# Patient Record
Sex: Male | Born: 1990 | Race: White | Hispanic: No | Marital: Married | Smoking: Current every day smoker
Health system: Southern US, Community
[De-identification: ages and names within clinical notes are randomized; demographics above are authoritative.]

## PROBLEM LIST (undated history)

## (undated) DIAGNOSIS — E079 Disorder of thyroid, unspecified: Secondary | ICD-10-CM

## (undated) DIAGNOSIS — K219 Gastro-esophageal reflux disease without esophagitis: Secondary | ICD-10-CM

## (undated) DIAGNOSIS — I10 Essential (primary) hypertension: Secondary | ICD-10-CM

## (undated) HISTORY — PX: TONSILLECTOMY: SUR1361

---
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
## 2011-06-18 ENCOUNTER — Emergency Department (HOSPITAL_COMMUNITY)
Admission: EM | Admit: 2011-06-18 | Discharge: 2011-06-19 | Disposition: A | Discharge status: Home / Self Care | Payer: BC Managed Care – PPO | Attending: Emergency Medicine | Admitting: Emergency Medicine

## 2011-06-18 DIAGNOSIS — I10 Essential (primary) hypertension: Secondary | ICD-10-CM | POA: Insufficient documentation

## 2011-06-18 DIAGNOSIS — L509 Urticaria, unspecified: Secondary | ICD-10-CM | POA: Insufficient documentation

## 2011-06-18 HISTORY — DX: Essential (primary) hypertension: I10

## 2011-06-18 MED ORDER — EPINEPHRINE HCL 0.1 MG/ML IJ SOLN
0.3000 mg | Freq: Once | INTRAMUSCULAR | Status: AC
  Administered 2011-06-18: 0.3 mg via INTRAMUSCULAR
  Filled 2011-06-18: qty 10

## 2011-06-18 MED ORDER — SODIUM CHLORIDE 0.9 % IV SOLN
Freq: Once | INTRAVENOUS | Status: AC
  Administered 2011-06-18: 23:00:00 via INTRAVENOUS

## 2011-06-18 MED ORDER — DIPHENHYDRAMINE HCL 50 MG/ML IJ SOLN
25.0000 mg | Freq: Once | INTRAMUSCULAR | Status: AC
  Administered 2011-06-18: 25 mg via INTRAVENOUS
  Filled 2011-06-18: qty 1

## 2011-06-18 MED ORDER — METHYLPREDNISOLONE SODIUM SUCC 125 MG IJ SOLR
125.0000 mg | Freq: Once | INTRAMUSCULAR | Status: AC
  Administered 2011-06-18: 125 mg via INTRAVENOUS
  Filled 2011-06-18: qty 2

## 2011-06-18 NOTE — ED Notes (Signed)
Allergic reaction to ?new body wash, first noted this am, tried benadryl, helps with itching, rash to entire body, denis diff. breathing

## 2011-06-18 NOTE — ED Provider Notes (Signed)
History     CSN: [id number] Arrival date & time: 06/18/2011 10:15 PM  Chief Complaint  Patient presents with  . Allergic Reaction    HPI  (Consider location/radiation/quality/duration/timing/severity/associated sxs/prior treatment)  HPI Comments: Pt  States about 7AM he awaken to a rash. He took a shower and took benadryl. This helped but the rash and itching came back. He denies any new foods or medications. No new otc meds.  He works around [employer]), but no new chemicals. No previous hx of rash or itching.  Patient is a 20 y.o. male presenting with allergic reaction. The history is provided by the patient.  Allergic Reaction The primary symptoms are  rash and urticaria. The primary symptoms do not include wheezing, shortness of breath, cough, abdominal pain, nausea, vomiting, dizziness, palpitations or angioedema. The current episode started 6 to 12 hours ago. The problem has been gradually worsening.  Associated with: Unknown.    Past Medical History  Diagnosis Date  . Hypertension     Past Surgical History  Procedure Date  . Tonsillectomy     No family history on file.  History  Substance Use Topics  . Smoking status: Never Smoker   . Smokeless tobacco: Not on file  . Alcohol Use: No      Review of Systems  Review of Systems  Constitutional: Negative for activity change.       All ROS Neg except as noted in HPI  HENT: Negative for nosebleeds and neck pain.   Eyes: Negative for photophobia and discharge.  Respiratory: Negative for cough, shortness of breath and wheezing.   Cardiovascular: Negative for chest pain and palpitations.  Gastrointestinal: Negative for nausea, vomiting, abdominal pain and blood in stool.  Genitourinary: Negative for dysuria, frequency and hematuria.  Musculoskeletal: Negative for back pain and arthralgias.  Skin: Positive for rash.  Neurological: Negative for dizziness, seizures and speech difficulty.    Psychiatric/Behavioral: Negative for hallucinations and confusion.    Allergies  Tylenol  Home Medications   Current Outpatient Rx  Name Route Sig Dispense Refill  . ENALAPRIL MALEATE 10 MG PO TABS Oral Take 10 mg by mouth 2 (two) times daily.      Marland Kitchen NIACIN (ANTIHYPERLIPIDEMIC) 1000 MG PO TBCR Oral Take 1,000 mg by mouth at bedtime.      Marland Kitchen FISH OIL 1000 MG PO CAPS Oral Take 1 capsule by mouth daily.        Physical Exam    BP 148/70  Pulse 115  Temp(Src) 99.3 F (37.4 C) (Oral)  Resp 22  Ht 6\' 1"  (1.854 m)  Wt 197 lb (89.359 kg)  BMI 25.99 kg/m2  SpO2 100%  Physical Exam  Nursing note and vitals reviewed. Constitutional: He is oriented to person, place, and time. He appears well-developed and well-nourished.  Non-toxic appearance.  HENT:  Head: Normocephalic.  Right Ear: Tympanic membrane and external ear normal.  Left Ear: Tympanic membrane and external ear normal.  Eyes: EOM and lids are normal. Pupils are equal, round, and reactive to light.  Neck: Normal range of motion. Neck supple. Carotid bruit is not present. No tracheal deviation present.  Cardiovascular: Normal rate, regular rhythm, normal heart sounds, intact distal pulses and normal pulses.   Pulmonary/Chest: Effort normal and breath sounds normal. No stridor. No respiratory distress. He has no wheezes. He has no rales.  Abdominal: Soft. Bowel sounds are normal. There is no tenderness. There is no guarding.  Musculoskeletal: Normal range of motion.  Lymphadenopathy:  Head (right side): No submandibular adenopathy present.       Head (left side): No submandibular adenopathy present.    He has no cervical adenopathy.  Neurological: He is alert and oriented to person, place, and time. He has normal strength. No cranial nerve deficit or sensory deficit.  Skin: Skin is warm and dry.       Multiple areas of hives and redness.  Psychiatric: He has a normal mood and affect. His speech is normal.    ED  Course:1148 Hives and redness improving. Pt states he feels a lot better. No SOB. Itching improved. Airway clear. 0015 Itching and hives continue to improve. Pt feels he can manage this at home with allergist evaluation.  Procedures (including critical care time)  Labs Reviewed - No data to display No results found.   Dx: Hives  MDM I have reviewed nursing notes, vital signs, and all appropriate lab and imaging results for this patient.  Plan: Rx for prednisone, allegra 180, and vistaril at bed time given. Pt to see MD at [facility].      Kathie Dike, [place] 06/19/11 [phone]

## 2011-06-19 MED ORDER — HYDROXYZINE PAMOATE 25 MG PO CAPS: ORAL_CAPSULE | ORAL | Status: DC

## 2011-06-19 MED ORDER — FEXOFENADINE HCL 180 MG PO TABS: 180.0000 mg | ORAL_TABLET | Freq: Every day | ORAL | Status: DC

## 2011-06-19 MED ORDER — PREDNISONE 10 MG PO TABS: ORAL_TABLET | ORAL | Status: DC

## 2011-07-14 NOTE — ED Provider Notes (Signed)
Evaluation and management procedures were performed by the PA/NP under my supervision/collaboration.    Dillie Burandt D Cataleyah Colborn, MD 07/14/11 1927 

## 2012-12-07 ENCOUNTER — Emergency Department (HOSPITAL_COMMUNITY)
Admission: EM | Admit: 2012-12-07 | Discharge: 2012-12-07 | Disposition: A | Discharge status: Home / Self Care | Payer: BC Managed Care – PPO | Attending: Emergency Medicine | Admitting: Emergency Medicine

## 2012-12-07 ENCOUNTER — Emergency Department (HOSPITAL_COMMUNITY): Payer: BC Managed Care – PPO

## 2012-12-07 ENCOUNTER — Encounter (HOSPITAL_COMMUNITY): Payer: Self-pay

## 2012-12-07 DIAGNOSIS — Z79899 Other long term (current) drug therapy: Secondary | ICD-10-CM | POA: Insufficient documentation

## 2012-12-07 DIAGNOSIS — I10 Essential (primary) hypertension: Secondary | ICD-10-CM | POA: Insufficient documentation

## 2012-12-07 DIAGNOSIS — R109 Unspecified abdominal pain: Secondary | ICD-10-CM | POA: Insufficient documentation

## 2012-12-07 LAB — CBC WITH DIFFERENTIAL/PLATELET
Eosinophils Absolute: 0.2 10*3/uL (ref 0.0–0.7)
Hemoglobin: 16.4 g/dL (ref 13.0–17.0)
Lymphs Abs: 1.9 10*3/uL (ref 0.7–4.0)
Monocytes Relative: 6 % (ref 3–12)
Neutro Abs: 6.9 10*3/uL (ref 1.7–7.7)
Neutrophils Relative %: 72 % (ref 43–77)
Platelets: 356 10*3/uL (ref 150–400)
RBC: 5.26 MIL/uL (ref 4.22–5.81)
WBC: 9.7 10*3/uL (ref 4.0–10.5)

## 2012-12-07 LAB — COMPREHENSIVE METABOLIC PANEL
BUN: 8 mg/dL (ref 6–23)
CO2: 27 mEq/L (ref 19–32)
Calcium: 10 mg/dL (ref 8.4–10.5)
Chloride: 101 mEq/L (ref 96–112)
Creatinine, Ser: 0.94 mg/dL (ref 0.50–1.35)
GFR calc Af Amer: >90 mL/min (ref >90)
GFR calc non Af Amer: >90 mL/min (ref >90)
Glucose, Bld: 97 mg/dL (ref 70–99)
Total Bilirubin: 0.6 mg/dL (ref 0.3–1.2)

## 2012-12-07 LAB — URINALYSIS, ROUTINE W REFLEX MICROSCOPIC
Bilirubin Urine: NEGATIVE
Glucose, UA: NEGATIVE mg/dL
Ketones, ur: NEGATIVE mg/dL
Leukocytes, UA: NEGATIVE
Protein, ur: NEGATIVE mg/dL

## 2012-12-07 MED ORDER — SODIUM CHLORIDE 0.9 % IV BOLUS (SEPSIS)
1000.0000 mL | Freq: Once | INTRAVENOUS | Status: AC
  Administered 2012-12-07: 1000 mL via INTRAVENOUS

## 2012-12-07 MED ORDER — KETOROLAC TROMETHAMINE 30 MG/ML IJ SOLN
30.0000 mg | Freq: Once | INTRAMUSCULAR | Status: AC
  Administered 2012-12-07: 30 mg via INTRAVENOUS
  Filled 2012-12-07: qty 1

## 2012-12-07 MED ORDER — ONDANSETRON HCL 4 MG/2ML IJ SOLN
4.0000 mg | Freq: Once | INTRAMUSCULAR | Status: AC
  Administered 2012-12-07: 4 mg via INTRAVENOUS
  Filled 2012-12-07: qty 2

## 2012-12-07 NOTE — ED Notes (Signed)
Pt reporting pain previously in right side, but improved at this time.  Also reporting some improvement in nausea.  No distress noted.  Denies needs at present.

## 2012-12-07 NOTE — ED Notes (Signed)
Pt complains of right flank pain that radiates to right groin. Pt denies dysuria and hematuria. Pt vomited a few times today. Denies fever.

## 2012-12-07 NOTE — ED Provider Notes (Signed)
History     CSN: [id number]  Arrival date & time 12/07/12  1750   First MD Initiated Contact with Patient 12/07/12 1911      Chief Complaint  Patient presents with  . Flank Pain    (Consider location/radiation/quality/duration/timing/severity/associated sxs/prior treatment) HPI The patient presents with the acute onset of right flank pain.  This began approximately 2 hours ago.  The pain was initially sharp, stabbing, with radiation to the inguinal crease.  After approximately 40 minutes the pain eased, and is only present in the right flank currently.  No specific attempts at relief.  No concurrent nausea, vomiting, dysuria, hematuria. The patient did have episodes of vomiting earlier today he states that this is not atypical for him he recently stopped taking antacids.  He notes that prior to that he had chronic GERD. The patient is generally healthy.  He does have a family history of polycystic kidney disease, though the patient does not have this entity.  Past Medical History  Diagnosis Date  . Hypertension     Past Surgical History  Procedure Laterality Date  . Tonsillectomy      No family history on file.  History  Substance Use Topics  . Smoking status: Never Smoker   . Smokeless tobacco: Not on file  . Alcohol Use: No      Review of Systems  Constitutional:       Per HPI, otherwise negative  HENT:       Per HPI, otherwise negative  Respiratory:       Per HPI, otherwise negative  Cardiovascular:       Per HPI, otherwise negative  Gastrointestinal: Negative for vomiting.  Endocrine:       Negative aside from HPI  Genitourinary:       Neg aside from HPI   Musculoskeletal:       Per HPI, otherwise negative  Skin: Negative.   Neurological: Negative for syncope.    Allergies  Tylenol; Codeine; and Penicillins  Home Medications   Current Outpatient Rx  Name  Route  Sig  Dispense  Refill  . enalapril (VASOTEC) 10 MG tablet   Oral   Take 10 mg by  mouth 2 (two) times daily.           Marland Kitchen omeprazole (PRILOSEC) 20 MG capsule   Oral   Take 20 mg by mouth daily.           BP 155/96  Pulse 90  Temp(Src) 97.9 F (36.6 C) (Oral)  Resp 16  Ht 6\' 1"  (1.854 m)  Wt 210 lb (95.255 kg)  BMI 27.71 kg/m2  SpO2 100%  Physical Exam  Nursing note and vitals reviewed. Constitutional: He is oriented to person, place, and time. He appears well-developed. No distress.  HENT:  Head: Normocephalic and atraumatic.  Eyes: Conjunctivae and EOM are normal.  Cardiovascular: Normal rate and regular rhythm.   Pulmonary/Chest: Effort normal. No stridor. No respiratory distress.  Abdominal: He exhibits no distension.  Musculoskeletal: He exhibits no edema.  Neurological: He is alert and oriented to person, place, and time.  Skin: Skin is warm and dry.  Psychiatric: He has a normal mood and affect.    ED Course  Procedures (including critical care time)  Labs Reviewed  URINALYSIS, ROUTINE W REFLEX MICROSCOPIC  COMPREHENSIVE METABOLIC PANEL  CBC WITH DIFFERENTIAL   No results found.   No diagnosis found.   Labs / CT reviewed MDM  This generally well-appearing young male, though with  a familial history of polycystic kidney disease now presents with the acute onset of right flank pain with radiation to the groin.  The patient denies any scrotal changes, and on exam is in no distress, with no focal physical exam abnormalities, unremarkable vital signs beyond mild hypertension.  The patient's labs and CT did not demonstrate presence of a Weseman come in or any notable abnormalities.  The etiology of the patient's pain is unclear, absent distress, with the aforementioned unremarkable findings, he is stable for discharge with followup as an outpatient.        Gerhard Munch, MD 12/07/12 [phone]

## 2013-05-09 ENCOUNTER — Encounter (HOSPITAL_COMMUNITY): Payer: Self-pay

## 2013-05-09 ENCOUNTER — Emergency Department (HOSPITAL_COMMUNITY)
Admission: EM | Admit: 2013-05-09 | Discharge: 2013-05-09 | Disposition: A | Discharge status: Home / Self Care | Payer: BC Managed Care – PPO | Attending: Emergency Medicine | Admitting: Emergency Medicine

## 2013-05-09 DIAGNOSIS — Y929 Unspecified place or not applicable: Secondary | ICD-10-CM | POA: Insufficient documentation

## 2013-05-09 DIAGNOSIS — Z79899 Other long term (current) drug therapy: Secondary | ICD-10-CM | POA: Insufficient documentation

## 2013-05-09 DIAGNOSIS — Y939 Activity, unspecified: Secondary | ICD-10-CM | POA: Insufficient documentation

## 2013-05-09 DIAGNOSIS — W268XXA Contact with other sharp object(s), not elsewhere classified, initial encounter: Secondary | ICD-10-CM | POA: Insufficient documentation

## 2013-05-09 DIAGNOSIS — I10 Essential (primary) hypertension: Secondary | ICD-10-CM | POA: Insufficient documentation

## 2013-05-09 DIAGNOSIS — Z880 Allergy status to penicillin: Secondary | ICD-10-CM | POA: Insufficient documentation

## 2013-05-09 DIAGNOSIS — S51812A Laceration without foreign body of left forearm, initial encounter: Secondary | ICD-10-CM

## 2013-05-09 DIAGNOSIS — S51809A Unspecified open wound of unspecified forearm, initial encounter: Secondary | ICD-10-CM | POA: Insufficient documentation

## 2013-05-09 MED ORDER — LIDOCAINE HCL (PF) 1 % IJ SOLN
5.0000 mL | Freq: Once | INTRAMUSCULAR | Status: AC
  Administered 2013-05-09: 5 mL

## 2013-05-09 MED ORDER — LIDOCAINE HCL (PF) 1 % IJ SOLN
INTRAMUSCULAR | Status: AC
  Filled 2013-05-09: qty 5

## 2013-05-09 NOTE — ED Provider Notes (Signed)
CSN: [id number]     Arrival date & time 05/09/13  1907 History     First MD Initiated Contact with Patient 05/09/13 1927     Chief Complaint  Patient presents with  . Extremity Laceration   (Consider location/radiation/quality/duration/timing/severity/associated sxs/prior Treatment) HPI Comments: Randy Goodman is a 22 y.o. Male presenting with a laceration to his left volar forearm, which occurred one hour before arrival when he dropped a saw blade (the saw was not running) on his forearm. He maintained gentle pressure and obtained hemostasis.  He denies pain, numbness, weakness or tingling distal to the injury site.  He is utd with his tetanus vaccines.      The history is provided by the patient.    Past Medical History  Diagnosis Date  . Hypertension    Past Surgical History  Procedure Laterality Date  . Tonsillectomy     History reviewed. No pertinent family history. History  Substance Use Topics  . Smoking status: Never Smoker   . Smokeless tobacco: Not on file  . Alcohol Use: No    Review of Systems  Constitutional: Negative for fever and chills.  HENT: Negative for facial swelling.   Respiratory: Negative for shortness of breath and wheezing.   Skin: Positive for wound.  Neurological: Negative for numbness.    Allergies  Tylenol; Codeine; and Penicillins  Home Medications   Current Outpatient Rx  Name  Route  Sig  Dispense  Refill  . enalapril (VASOTEC) 10 MG tablet   Oral   Take 10 mg by mouth 2 (two) times daily.           Marland Kitchen omeprazole (PRILOSEC) 20 MG capsule   Oral   Take 20 mg by mouth daily.          BP 163/91  Pulse 88  Temp(Src) 98.1 F (36.7 C) (Oral)  Resp 18  Ht 6' (1.829 m)  Wt 210 lb (95.255 kg)  BMI 28.47 kg/m2  SpO2 100% Physical Exam  Constitutional: He is oriented to person, place, and time. He appears well-developed and well-nourished.  HENT:  Head: Normocephalic.  Cardiovascular: Normal rate.   Pulmonary/Chest:  Effort normal.  Musculoskeletal: He exhibits tenderness.  2 cm subcutaneous laceration mid volar left forearm,  Hemostatic.    Neurological: He is alert and oriented to person, place, and time. No sensory deficit.  FROM of fingers, wrist,  No decreased strength, normal sensation.  Skin: Laceration noted.    ED Course   Procedures (including critical care time)  LACERATION REPAIR Performed by: Burgess Amor Authorized by: Burgess Amor Consent: Verbal consent obtained. Risks and benefits: risks, benefits and alternatives were discussed Consent given by: patient Patient identity confirmed: provided demographic data Prepped and Draped in normal sterile fashion Wound explored  Laceration Location: left forearm  Laceration Length: 2cm  No Foreign Bodies seen or palpated  Anesthesia: local infiltration  Local anesthetic: lidocaine 1% without epinephrine  Anesthetic total: 2 ml  Irrigation method: syringe Amount of cleaning: standard  Skin closure: ethilon 4-0  Number of sutures: 5  Technique: simple interrupted  Patient tolerance: Patient tolerated the procedure well with no immediate complications.   Labs Reviewed - No data to display No results found. 1. Laceration of left forearm without complication     MDM  Wound care instructions given.  Pt advised to have sutures removed in 10 days,  Return here sooner for any signs of infection including redness, swelling, worse pain or drainage of pus.  Burgess Amor, PA-C 05/09/13 2035

## 2013-05-09 NOTE — ED Notes (Signed)
I was cutting some wood and dropped the saw (it was not running) and the sharp blade cut my arm. Bleeding controlled at this time. Laceration located to left forearm.

## 2013-05-09 NOTE — ED Notes (Signed)
Lac to lt forearm sutured. And bandaged. Cut on a saw.

## 2013-05-10 NOTE — ED Provider Notes (Signed)
Medical screening examination/treatment/procedure(s) were performed by non-physician practitioner and as supervising physician I was immediately available for consultation/collaboration.  Malay Fantroy, MD 05/10/13 1613 

## 2014-06-24 ENCOUNTER — Encounter (HOSPITAL_COMMUNITY): Payer: Self-pay | Admitting: Emergency Medicine

## 2014-06-24 ENCOUNTER — Emergency Department (HOSPITAL_COMMUNITY)
Admission: EM | Admit: 2014-06-24 | Discharge: 2014-06-24 | Disposition: A | Discharge status: Home / Self Care | Payer: BC Managed Care – PPO | Attending: Emergency Medicine | Admitting: Emergency Medicine

## 2014-06-24 DIAGNOSIS — L259 Unspecified contact dermatitis, unspecified cause: Secondary | ICD-10-CM | POA: Insufficient documentation

## 2014-06-24 DIAGNOSIS — R21 Rash and other nonspecific skin eruption: Secondary | ICD-10-CM | POA: Insufficient documentation

## 2014-06-24 DIAGNOSIS — Z880 Allergy status to penicillin: Secondary | ICD-10-CM | POA: Diagnosis not present

## 2014-06-24 DIAGNOSIS — L239 Allergic contact dermatitis, unspecified cause: Secondary | ICD-10-CM

## 2014-06-24 DIAGNOSIS — I10 Essential (primary) hypertension: Secondary | ICD-10-CM | POA: Insufficient documentation

## 2014-06-24 DIAGNOSIS — Z79899 Other long term (current) drug therapy: Secondary | ICD-10-CM | POA: Diagnosis not present

## 2014-06-24 MED ORDER — LORATADINE 10 MG PO TABS: 10.0000 mg | ORAL_TABLET | Freq: Every day | ORAL | Status: DC

## 2014-06-24 MED ORDER — METHYLPREDNISOLONE SODIUM SUCC 40 MG IJ SOLR
80.0000 mg | Freq: Once | INTRAMUSCULAR | Status: AC
  Administered 2014-06-24: 80 mg via INTRAVENOUS
  Filled 2014-06-24: qty 2

## 2014-06-24 MED ORDER — PREDNISONE 20 MG PO TABS: 20.0000 mg | ORAL_TABLET | Freq: Two times a day (BID) | ORAL | Status: DC

## 2014-06-24 MED ORDER — HYDROXYZINE HCL 25 MG PO TABS: 25.0000 mg | ORAL_TABLET | Freq: Four times a day (QID) | ORAL | Status: DC

## 2014-06-24 NOTE — ED Notes (Signed)
Rash for 2 days with itching, recently helped a friend unload hay on Saturday but rash is all over, states hay only touched his arms, also had used a new body wash few days ago but went to back to using his usual wash

## 2014-06-24 NOTE — ED Provider Notes (Signed)
CSN: [id number]     Arrival date & time 06/24/14  1503 History   First MD Initiated Contact with Patient 06/24/14 1627     Chief Complaint  Patient presents with  . Rash     (Consider location/radiation/quality/duration/timing/severity/associated sxs/prior Treatment) Patient is a 23 y.o. male presenting with rash. The history is provided by the patient.  Rash Location:  Full body Quality: dryness, itchiness and redness   Severity:  Moderate Onset quality:  Gradual Duration:  2 days Timing:  Constant Progression:  Unchanged Chronicity:  New Context: new detergent/soap   Context comment:  Hay Relieved by:  Nothing Worsened by:  Gracelyn Nurse  is a 23 y.o. male who presents to the ED with rash and itching that started 2 days ago. He reports helping a friend unload hay and the rash started after that. He also used a new body wash but after the rash started he went back to his regular soap.   Past Medical History  Diagnosis Date  . Hypertension    Past Surgical History  Procedure Laterality Date  . Tonsillectomy     History reviewed. No pertinent family history. History  Substance Use Topics  . Smoking status: Never Smoker   . Smokeless tobacco: Not on file  . Alcohol Use: No    Review of Systems  Skin: Positive for rash.  all other systems negative    Allergies  Codeine and Penicillins  Home Medications   Prior to Admission medications   Medication Sig Start Date End Date Taking? Authorizing Provider  enalapril (VASOTEC) 10 MG tablet Take 10 mg by mouth 2 (two) times daily.      Historical Provider, MD  omeprazole (PRILOSEC) 20 MG capsule Take 20 mg by mouth daily.    Historical Provider, MD   BP 150/88  Pulse 59  Temp(Src) 98.6 F (37 C) (Oral)  Resp 16  Ht 6\' 1"  (1.854 m)  Wt 220 lb (99.791 kg)  BMI 29.03 kg/m2  SpO2 100% Physical Exam  Nursing note and vitals reviewed. Constitutional: He is oriented to person, place, and time. He appears  well-developed and well-nourished.  HENT:  Head: Normocephalic.  Eyes: EOM are normal.  Neck: Normal range of motion. Neck supple.  Cardiovascular: Normal rate and regular rhythm.   Pulmonary/Chest: Effort normal. He has no wheezes. He has no rales.  Musculoskeletal: Normal range of motion.  Neurological: He is alert and oriented to person, place, and time. No cranial nerve deficit.  Skin: Skin is warm and dry. Rash noted.  There are red raised area to the upper body that is worse at the belt line and bilateral axilla. He has no rash in the genital area. Minimal rash of the lower extremities.   Psychiatric: He has a normal mood and affect. His behavior is normal.    ED Course  Procedures (including critical care time) Labs Review  MDM  23 y.o. male with rash and itching after unloading hay 2 days ago. Will treat for allergic reaction. Patient stable for discharge without any immediate complications. Discussed with the patient and all questioned fully answered. He will return if any problems arise.    Medication List    TAKE these medications       hydrOXYzine 25 MG tablet  Commonly known as:  ATARAX/VISTARIL  Take 1 tablet (25 mg total) by mouth every 6 (six) hours.     loratadine 10 MG tablet  Commonly known as:  CLARITIN  Take  1 tablet (10 mg total) by mouth daily.     predniSONE 20 MG tablet  Commonly known as:  DELTASONE  Take 1 tablet (20 mg total) by mouth 2 (two) times daily with a meal.      ASK your doctor about these medications       enalapril 10 MG tablet  Commonly known as:  VASOTEC  Take 10 mg by mouth 2 (two) times daily.     omeprazole 20 MG capsule  Commonly known as:  PRILOSEC  Take 20 mg by mouth daily.           [id number]Shylynn Bruning RonanM Derak Schurman, [place]NP 06/25/14 [phone]

## 2014-06-24 NOTE — Discharge Instructions (Signed)
If your symptoms persist follow up with DR. Hall. Return here as needed.   Contact Dermatitis Contact dermatitis is a reaction to certain substances that touch the skin. Contact dermatitis can be either irritant contact dermatitis or allergic contact dermatitis. Irritant contact dermatitis does not require previous exposure to the substance for a reaction to occur.Allergic contact dermatitis only occurs if you have been exposed to the substance before. Upon a repeat exposure, your body reacts to the substance.  CAUSES  Many substances can cause contact dermatitis. Irritant dermatitis is most commonly caused by repeated exposure to mildly irritating substances, such as:  Makeup.  Soaps.  Detergents.  Bleaches.  Acids.  Metal salts, such as nickel. Allergic contact dermatitis is most commonly caused by exposure to:  Poisonous plants.  Chemicals (deodorants, shampoos).  Jewelry.  Latex.  Neomycin in triple antibiotic cream.  Preservatives in products, including clothing. SYMPTOMS  The area of skin that is exposed may develop:  Dryness or flaking.  Redness.  Cracks.  Itching.  Pain or a burning sensation.  Blisters. With allergic contact dermatitis, there may also be swelling in areas such as the eyelids, mouth, or genitals.  DIAGNOSIS  Your caregiver can usually tell what the problem is by doing a physical exam. In cases where the cause is uncertain and an allergic contact dermatitis is suspected, a patch skin test may be performed to help determine the cause of your dermatitis. TREATMENT Treatment includes protecting the skin from further contact with the irritating substance by avoiding that substance if possible. Barrier creams, powders, and gloves may be helpful. Your caregiver may also recommend:  Steroid creams or ointments applied 2 times daily. For best results, soak the rash area in cool water for 20 minutes. Then apply the medicine. Cover the area with a  plastic wrap. You can store the steroid cream in the refrigerator for a "chilly" effect on your rash. That may decrease itching. Oral steroid medicines may be needed in more severe cases.  Antibiotics or antibacterial ointments if a skin infection is present.  Antihistamine lotion or an antihistamine taken by mouth to ease itching.  Lubricants to keep moisture in your skin.  Burow's solution to reduce redness and soreness or to dry a weeping rash. Mix one packet or tablet of solution in 2 cups cool water. Dip a clean washcloth in the mixture, wring it out a bit, and put it on the affected area. Leave the cloth in place for 30 minutes. Do this as often as possible throughout the day.  Taking several cornstarch or baking soda baths daily if the area is too large to cover with a washcloth. Harsh chemicals, such as alkalis or acids, can cause skin damage that is like a burn. You should flush your skin for 15 to 20 minutes with cold water after such an exposure. You should also seek immediate medical care after exposure. Bandages (dressings), antibiotics, and pain medicine may be needed for severely irritated skin.  HOME CARE INSTRUCTIONS  Avoid the substance that caused your reaction.  Keep the area of skin that is affected away from hot water, soap, sunlight, chemicals, acidic substances, or anything else that would irritate your skin.  Do not scratch the rash. Scratching may cause the rash to become infected.  You may take cool baths to help stop the itching.  Only take over-the-counter or prescription medicines as directed by your caregiver.  See your caregiver for follow-up care as directed to make sure your skin is  healing properly. SEEK MEDICAL CARE IF:   Your condition is not better after 3 days of treatment.  You seem to be getting worse.  You see signs of infection such as swelling, tenderness, redness, soreness, or warmth in the affected area.  You have any problems related to  your medicines. Document Released: 09/08/2000 Document Revised: 12/04/2011 Document Reviewed: 02/14/2011 [facility] Patient Information 2015 [place], [place]. This information is not intended to replace advice given to you by your health care provider. Make sure you discuss any questions you have with your health care provider.

## 2014-06-24 NOTE — ED Notes (Signed)
Pt denies difficulty breathing or swallowing. Lungs clear upon auscultation.

## 2014-06-25 NOTE — ED Provider Notes (Signed)
Medical screening examination/treatment/procedure(s) were performed by non-physician practitioner and as supervising physician I was immediately available for consultation/collaboration.   EKG Interpretation None        Kristen N Ward, DO 06/25/14 2351 

## 2015-04-02 ENCOUNTER — Encounter (HOSPITAL_COMMUNITY): Payer: Self-pay | Admitting: Emergency Medicine

## 2015-04-02 ENCOUNTER — Emergency Department (HOSPITAL_COMMUNITY)
Admission: EM | Admit: 2015-04-02 | Discharge: 2015-04-02 | Disposition: A | Discharge status: Home / Self Care | Payer: BLUE CROSS/BLUE SHIELD | Attending: Emergency Medicine | Admitting: Emergency Medicine

## 2015-04-02 DIAGNOSIS — Z880 Allergy status to penicillin: Secondary | ICD-10-CM | POA: Insufficient documentation

## 2015-04-02 DIAGNOSIS — Z79899 Other long term (current) drug therapy: Secondary | ICD-10-CM | POA: Insufficient documentation

## 2015-04-02 DIAGNOSIS — I10 Essential (primary) hypertension: Secondary | ICD-10-CM | POA: Diagnosis not present

## 2015-04-02 DIAGNOSIS — F419 Anxiety disorder, unspecified: Secondary | ICD-10-CM

## 2015-04-02 DIAGNOSIS — K219 Gastro-esophageal reflux disease without esophagitis: Secondary | ICD-10-CM | POA: Insufficient documentation

## 2015-04-02 DIAGNOSIS — Z7952 Long term (current) use of systemic steroids: Secondary | ICD-10-CM | POA: Insufficient documentation

## 2015-04-02 DIAGNOSIS — R0602 Shortness of breath: Secondary | ICD-10-CM | POA: Diagnosis present

## 2015-04-02 HISTORY — DX: Gastro-esophageal reflux disease without esophagitis: K21.9

## 2015-04-02 NOTE — ED Notes (Signed)
Unable to get 12 lead EKG to electronically transfer. [facility] and then to Dr. Hassie Bruceey for review.

## 2015-04-02 NOTE — ED Provider Notes (Signed)
CSN: [id number]     Arrival date & time 04/02/15  1543 History   First MD Initiated Contact with Patient 04/02/15 1614     Chief Complaint  Patient presents with  . Shortness of Breath     (Consider location/radiation/quality/duration/timing/severity/associated sxs/prior Treatment) HPI Comments: Patient is a 24 year old male who presents to the emergency department with a complaint of shortness of breath.  The patient states that he has a history of gastroesophageal reflux disease. He states that from time to time he feels as though his food gets stuck in the back of his throat or mid way his esophagus. He previously had problems with actually throwing up, but since being on Prilosec, he states this has improved some. He states however that now whenever he gets the sensation he also has a sensation of feeling short of breath and very anxious. The patient states that he had an episode approximately 3 months ago, another episode a month ago, and another episode 3 times during this week. The patient states that he is not working right now because his plan is been closed down since July 4, and he wonders if he does not have the distractions that he would normally have left lower cell to get anxious. The patient denies any chest pain. Patient denies unusual sweats. He has not been vomiting during the episodes of the last week. He has not had any unusual weakness, and has been no syncopal episodes. The patient has not had any injury or trauma to the chest. He's had no operations on the esophagus or the chest. His been no blood in his stool. And he is not vomiting any blood. The patient is not a smoker. He states that he occasionally drinks alcohol in particular beer on the weekends, and he denies use of any recreational drug.   PCP: Dr Sherwood GamblerFusco  Patient is a 24 y.o. male presenting with shortness of breath. The history is provided by the patient.  Shortness of Breath Associated symptoms: no chest pain      Past Medical History  Diagnosis Date  . Hypertension   . GERD (gastroesophageal reflux disease)    Past Surgical History  Procedure Laterality Date  . Tonsillectomy     Family History  Problem Relation Age of Onset  . Hypertension Mother   . Hypertension Father   . Kidney disease Father   . Hypertension Other    History  Substance Use Topics  . Smoking status: Never Smoker   . Smokeless tobacco: Never Used  . Alcohol Use: 4.8 oz/week    8 Cans of beer per week     Comment: occas    Review of Systems  Respiratory: Positive for shortness of breath.   Cardiovascular: Negative for chest pain.  Gastrointestinal:       Indigestion  Psychiatric/Behavioral: The patient is nervous/anxious.   All other systems reviewed and are negative.     Allergies  Codeine and Penicillins  Home Medications   Prior to Admission medications   Medication Sig Start Date End Date Taking? Authorizing Provider  enalapril (VASOTEC) 10 MG tablet Take 10 mg by mouth 2 (two) times daily.      Historical Provider, MD  hydrOXYzine (ATARAX/VISTARIL) 25 MG tablet Take 1 tablet (25 mg total) by mouth every 6 (six) hours. 06/24/14   Hope Orlene OchM Neese, NP  loratadine (CLARITIN) 10 MG tablet Take 1 tablet (10 mg total) by mouth daily. 06/24/14   Hope Orlene OchM Neese, NP  omeprazole (PRILOSEC) 20  MG capsule Take 20 mg by mouth daily.    Historical Provider, MD  predniSONE (DELTASONE) 20 MG tablet Take 1 tablet (20 mg total) by mouth 2 (two) times daily with a meal. 06/24/14   Hope Orlene Och, NP   BP 129/84 mmHg  Pulse 77  Temp(Src) 98.7 F (37.1 C) (Oral)  Resp 18  Ht  (1.854 m)  Wt 230 lb (104.327 kg)  BMI 30.35 kg/m2  SpO2 100% Physical Exam  Constitutional: He is oriented to person, place, and time. He appears well-developed and well-nourished.  Non-toxic appearance.  HENT:  Head: Normocephalic.  Right Ear: Tympanic membrane and external ear normal.  Left Ear: Tympanic membrane and external ear  normal.  Eyes: EOM and lids are normal. Pupils are equal, round, and reactive to light.  Neck: Normal range of motion. Neck supple. Carotid bruit is not present.  Cardiovascular: Normal rate, regular rhythm, normal heart sounds, intact distal pulses and normal pulses.   Pulmonary/Chest: Breath sounds normal. No respiratory distress.  Abdominal: Soft. Bowel sounds are normal. There is no tenderness. There is no guarding.  Musculoskeletal: Normal range of motion.  Lymphadenopathy:       Head (right side): No submandibular adenopathy present.       Head (left side): No submandibular adenopathy present.    He has no cervical adenopathy.  Neurological: He is alert and oriented to person, place, and time. He has normal strength. No cranial nerve deficit or sensory deficit.  Skin: Skin is warm and dry.  Psychiatric: He has a normal mood and affect. His speech is normal.  Nursing note and vitals reviewed.   ED Course  Procedures (including critical care time) Labs Review Labs Reviewed - No data to display  Imaging Review No results found.   EKG Interpretation None      MDM  Vital signs stable. Pt no longer anxious. No difficulty breathing. Pt sitting up in bed, conversing with family without problem. No sweats, no chest pain, No hx of injury or change in environment. EKG neg for acute issues. Hx of reflux. Suspect panic/anxiety problem. Pt to discuss this with Dr Sherwood Gambler or a member of his team. Pt in agreement with this plan. He will return to the ED if any changes or problem.   Final diagnoses:  None    *I have reviewed nursing notes, vital signs, and all appropriate lab and imaging results for this patient.[address] PA-C 04/02/15 1712  Margarita Grizzle, MD 04/03/15 [phone]

## 2015-04-02 NOTE — ED Notes (Signed)
Pt reports feeling SOB intermittently x 1 week. NAD noted. Pt states he has hx of GERD, may be having difficulty with reflux. Pt denies CP or dizziness.

## 2015-04-02 NOTE — Discharge Instructions (Signed)
Please see Dr Sherwood GamblerFusco or a member of his team to discuss the problem with swallowing and digesting food, and the problem with anxiety as soon as possible. Return to the Emergency Dept if any changes or problem. Gastroesophageal Reflux Disease, Adult Gastroesophageal reflux disease (GERD) happens when acid from your stomach flows up into the esophagus. When acid comes in contact with the esophagus, the acid causes soreness (inflammation) in the esophagus. Over time, GERD may create small holes (ulcers) in the lining of the esophagus. CAUSES   Increased body weight. This puts pressure on the stomach, making acid rise from the stomach into the esophagus.  Smoking. This increases acid production in the stomach.  Drinking alcohol. This causes decreased pressure in the lower esophageal sphincter (valve or ring of muscle between the esophagus and stomach), allowing acid from the stomach into the esophagus.  Late evening meals and a full stomach. This increases pressure and acid production in the stomach.  A malformed lower esophageal sphincter. Sometimes, no cause is found. SYMPTOMS   Burning pain in the lower part of the mid-chest behind the breastbone and in the mid-stomach area. This may occur twice a week or more often.  Trouble swallowing.  Sore throat.  Dry cough.  Asthma-like symptoms including chest tightness, shortness of breath, or wheezing. DIAGNOSIS  Your caregiver may be able to diagnose GERD based on your symptoms. In some cases, X-rays and other tests may be done to check for complications or to check the condition of your stomach and esophagus. TREATMENT  Your caregiver may recommend over-the-counter or prescription medicines to help decrease acid production. Ask your caregiver before starting or adding any new medicines.  HOME CARE INSTRUCTIONS   Change the factors that you can control. Ask your caregiver for guidance concerning weight loss, quitting smoking, and alcohol  consumption.  Avoid foods and drinks that make your symptoms worse, such as:  Caffeine or alcoholic drinks.  Chocolate.  Peppermint or mint flavorings.  Garlic and onions.  Spicy foods.  Citrus fruits, such as oranges, lemons, or limes.  Tomato-based foods such as sauce, chili, salsa, and pizza.  Fried and fatty foods.  Avoid lying down for the 3 hours prior to your bedtime or prior to taking a nap.  Eat small, frequent meals instead of large meals.  Wear loose-fitting clothing. Do not wear anything tight around your waist that causes pressure on your stomach.  Raise the head of your bed 6 to 8 inches with wood blocks to help you sleep. Extra pillows will not help.  Only take over-the-counter or prescription medicines for pain, discomfort, or fever as directed by your caregiver.  Do not take aspirin, ibuprofen, or other nonsteroidal anti-inflammatory drugs (NSAIDs). SEEK IMMEDIATE MEDICAL CARE IF:   You have pain in your arms, neck, jaw, teeth, or back.  Your pain increases or changes in intensity or duration.  You develop nausea, vomiting, or sweating (diaphoresis).  You develop shortness of breath, or you faint.  Your vomit is green, yellow, black, or looks like coffee grounds or blood.  Your stool is red, bloody, or black. These symptoms could be signs of other problems, such as heart disease, gastric bleeding, or esophageal bleeding. MAKE SURE YOU:   Understand these instructions.  Will watch your condition.  Will get help right away if you are not doing well or get worse. Document Released: 06/21/2005 Document Revised: 12/04/2011 Document Reviewed: 03/31/2011 [facility] Patient Information 2015 [place], [place]. This information is not intended to replace  advice given to you by your health care provider. Make sure you discuss any questions you have with your health care provider.  Panic Attacks Panic attacks are sudden, short feelings of great fear or  discomfort. You may have them for no reason when you are relaxed, when you are uneasy (anxious), or when you are sleeping.  HOME CARE  Take all your medicines as told.  Check with your doctor before starting new medicines.  Keep all doctor visits. GET HELP IF:  You are not able to take your medicines as told.  Your symptoms do not get better.  Your symptoms get worse. GET HELP RIGHT AWAY IF:  Your attacks seem different than your normal attacks.  You have thoughts about hurting yourself or others.  You take panic attack medicine and you have a side effect. MAKE SURE YOU:  Understand these instructions.  Will watch your condition.  Will get help right away if you are not doing well or get worse. Document Released: 10/14/2010 Document Revised: 07/02/2013 Document Reviewed: 04/25/2013 [facility] Patient Information 2015 [place], [place]. This information is not intended to replace advice given to you by your health care provider. Make sure you discuss any questions you have with your health care provider.

## 2015-05-24 ENCOUNTER — Emergency Department (HOSPITAL_COMMUNITY)
Admission: EM | Admit: 2015-05-24 | Discharge: 2015-05-25 | Disposition: A | Discharge status: Home / Self Care | Payer: BLUE CROSS/BLUE SHIELD | Attending: Emergency Medicine | Admitting: Emergency Medicine

## 2015-05-24 ENCOUNTER — Encounter (HOSPITAL_COMMUNITY): Payer: Self-pay | Admitting: Emergency Medicine

## 2015-05-24 DIAGNOSIS — Z79899 Other long term (current) drug therapy: Secondary | ICD-10-CM | POA: Diagnosis not present

## 2015-05-24 DIAGNOSIS — I10 Essential (primary) hypertension: Secondary | ICD-10-CM | POA: Diagnosis not present

## 2015-05-24 DIAGNOSIS — K219 Gastro-esophageal reflux disease without esophagitis: Secondary | ICD-10-CM | POA: Insufficient documentation

## 2015-05-24 DIAGNOSIS — Z880 Allergy status to penicillin: Secondary | ICD-10-CM | POA: Diagnosis not present

## 2015-05-24 DIAGNOSIS — Z7952 Long term (current) use of systemic steroids: Secondary | ICD-10-CM | POA: Insufficient documentation

## 2015-05-24 DIAGNOSIS — F410 Panic disorder [episodic paroxysmal anxiety] without agoraphobia: Secondary | ICD-10-CM | POA: Diagnosis not present

## 2015-05-24 DIAGNOSIS — R0602 Shortness of breath: Secondary | ICD-10-CM | POA: Diagnosis present

## 2015-05-24 NOTE — ED Notes (Signed)
Pt c/o sob and states he does not feel right.

## 2015-05-25 ENCOUNTER — Emergency Department (HOSPITAL_COMMUNITY): Payer: BLUE CROSS/BLUE SHIELD

## 2015-05-25 NOTE — Discharge Instructions (Signed)
You should follow-up with her primary doctor regarding starting a different medication for depression and anxiety. If he develops thoughts of wanting to hurt herself or anyone else he should return immediately.   Panic Attacks Panic attacks are sudden, short-livedsurges of severe anxiety, fear, or discomfort. They may occur for no reason when you are relaxed, when you are anxious, or when you are sleeping. Panic attacks may occur for a number of reasons:   Healthy people occasionally have panic attacks in extreme, life-threatening situations, such as war or natural disasters. Normal anxiety is a protective mechanism of the body that helps [place] react to danger (fight or flight response).  Panic attacks are often seen with anxiety disorders, such as panic disorder, social anxiety disorder, generalized anxiety disorder, and phobias. Anxiety disorders cause excessive or uncontrollable anxiety. They may interfere with your relationships or other life activities.  Panic attacks are sometimes seen with other mental illnesses, such as depression and posttraumatic stress disorder.  Certain medical conditions, prescription medicines, and drugs of abuse can cause panic attacks. SYMPTOMS  Panic attacks start suddenly, peak within 20 minutes, and are accompanied by four or more of the following symptoms:  Pounding heart or fast heart rate (palpitations).  Sweating.  Trembling or shaking.  Shortness of breath or feeling smothered.  Feeling choked.  Chest pain or discomfort.  Nausea or strange feeling in your stomach.  Dizziness, light-headedness, or feeling like you will faint.  Chills or hot flushes.  Numbness or tingling in your lips or hands and feet.  Feeling that things are not real or feeling that you are not yourself.  Fear of losing control or going crazy.  Fear of dying. Some of these symptoms can mimic serious medical conditions. For example, you may think you are having a heart  attack. Although panic attacks can be very scary, they are not life threatening. DIAGNOSIS  Panic attacks are diagnosed through an assessment by your health care provider. Your health care provider will ask questions about your symptoms, such as where and when they occurred. Your health care provider will also ask about your medical history and use of alcohol and drugs, including prescription medicines. Your health care provider may order blood tests or other studies to rule out a serious medical condition. Your health care provider may refer you to a mental health professional for further evaluation. TREATMENT   Most healthy people who have one or two panic attacks in an extreme, life-threatening situation will not require treatment.  The treatment for panic attacks associated with anxiety disorders or other mental illness typically involves counseling with a mental health professional, medicine, or a combination of both. Your health care provider will help determine what treatment is best for you.  Panic attacks due to physical illness usually go away with treatment of the illness. If prescription medicine is causing panic attacks, talk with your health care provider about stopping the medicine, decreasing the dose, or substituting another medicine.  Panic attacks due to alcohol or drug abuse go away with abstinence. Some adults need professional help in order to stop drinking or using drugs. HOME CARE INSTRUCTIONS   Take all medicines as directed by your health care provider.   Schedule and attend follow-up visits as directed by your health care provider. It is important to keep all your appointments. SEEK MEDICAL CARE IF:  You are not able to take your medicines as prescribed.  Your symptoms do not improve or get worse. SEEK IMMEDIATE MEDICAL CARE  IF:   You experience panic attack symptoms that are different than your usual symptoms.  You have serious thoughts about hurting yourself or  others.  You are taking medicine for panic attacks and have a serious side effect. MAKE SURE YOU:  Understand these instructions.  Will watch your condition.  Will get help right away if you are not doing well or get worse. Document Released: 09/11/2005 Document Revised: 09/16/2013 Document Reviewed: 04/25/2013 [facility] Patient Information 2015 [place], [place]. This information is not intended to replace advice given to you by your health care provider. Make sure you discuss any questions you have with your health care provider.

## 2015-05-25 NOTE — ED Provider Notes (Signed)
CSN: [id number]     Arrival date & time 05/24/15  2320 History   First MD Initiated Contact with Patient 05/24/15 2356     Chief Complaint  Patient presents with  . Shortness of Breath     (Consider location/radiation/quality/duration/timing/severity/associated sxs/prior Treatment) HPI  This is a 24 year old male with history of hypertension who presents with anxiety attacks.  Patient reports a recent history of frequent and recurrent anxiety attacks. He describes feeling very short of breath and unable to catch his breath. He has to breathe very deeply to get it under control. He saw his primary physician and was prescribed Wellbutrin and Xanax when necessary. He states that after he began to take the Wellbutrin he began to have bad dreams and feelings of suicidal ideation. He stopped the Wellbutrin and these thoughts discontinued. She denies any SI or HI right now. He states he just doesn't feel well. He had a bad panic attack prior to arrival. He also reports decreased appetite and decreased interest in things that he used to enjoy doing. He has a supportive wife at the bedside. Denies any new stressors.  Past Medical History  Diagnosis Date  . Hypertension   . GERD (gastroesophageal reflux disease)    Past Surgical History  Procedure Laterality Date  . Tonsillectomy     Family History  Problem Relation Age of Onset  . Hypertension Mother   . Hypertension Father   . Kidney disease Father   . Hypertension Other    Social History  Substance Use Topics  . Smoking status: Never Smoker   . Smokeless tobacco: Never Used  . Alcohol Use: 4.8 oz/week    8 Cans of beer per week     Comment: occas    Review of Systems  Constitutional: Positive for activity change and appetite change. Negative for unexpected weight change.  Respiratory: Positive for shortness of breath. Negative for chest tightness.   Cardiovascular: Negative.  Negative for chest pain.  Gastrointestinal: Negative.    Genitourinary: Negative.   Psychiatric/Behavioral: Negative for suicidal ideas and hallucinations.  All other systems reviewed and are negative.     Allergies  Codeine and Penicillins  Home Medications   Prior to Admission medications   Medication Sig Start Date End Date Taking? Authorizing Provider  enalapril (VASOTEC) 10 MG tablet Take 10 mg by mouth 2 (two) times daily.     Yes Historical Provider, MD  omeprazole (PRILOSEC) 20 MG capsule Take 20 mg by mouth daily.   Yes Historical Provider, MD  hydrOXYzine (ATARAX/VISTARIL) 25 MG tablet Take 1 tablet (25 mg total) by mouth every 6 (six) hours. 06/24/14   Hope Orlene Och, NP  loratadine (CLARITIN) 10 MG tablet Take 1 tablet (10 mg total) by mouth daily. 06/24/14   Hope Orlene Och, NP  predniSONE (DELTASONE) 20 MG tablet Take 1 tablet (20 mg total) by mouth 2 (two) times daily with a meal. 06/24/14   Hope Orlene Och, NP   BP 157/93 mmHg  Pulse 74  Temp(Src) 97.4 F (36.3 C)  Resp 18  Ht  (1.854 m)  Wt 230 lb (104.327 kg)  BMI 30.35 kg/m2  SpO2 100% Physical Exam  Constitutional: He is oriented to person, place, and time. He appears well-developed and well-nourished. No distress.  HENT:  Head: Normocephalic and atraumatic.  Cardiovascular: Normal rate, regular rhythm and normal heart sounds.   No murmur heard. Pulmonary/Chest: Effort [id number] breath sounds normal. No respiratory distress. He has no wheezes.  Musculoskeletal: He exhibits no edema.  Neurological: He is alert and oriented to person, place, and time.  Skin: Skin is warm and dry.  Psychiatric: He has a normal mood and affect.  Nursing note and vitals reviewed.   ED Course  Procedures (including critical care time) Labs Review Labs Reviewed - No data to display  Imaging Review Dg Chest 2 View  05/25/2015   CLINICAL DATA:  Intermittent dyspnea and chest tightness.  EXAM: CHEST  2 VIEW  COMPARISON:  None.  FINDINGS: The heart size and mediastinal contours are  within normal limits. Both lungs are clear. The visualized skeletal structures are unremarkable.  IMPRESSION: No active cardiopulmonary disease.   Electronically Signed   By: Ellery Plunk M.D.   On: 05/25/2015 00:25   I have personally reviewed and evaluated these images and lab results as part of my medical decision-making.   EKG Interpretation None      MDM   Final diagnoses:  Panic attacks    Patient presents with increasing anxiety attacks and shortness of breath. Nontoxic on exam. Afebrile and sats 100%. Chest x-ray reassuring. Patient's presentation is most consistent with depression, located by anxiety attacks. He is very insightful. No suicidal ideation at this time. This was linked to Wellbutrin. Had a long discussion with the patient and his wife. He should follow-up very closely with his primary physician. He likely does need additional education and/or psychiatry referral. Patient stated understanding. He contracted for safety. He will return if he has any new or worsening symptoms including recurrent suicidal ideation.  After history, exam, and medical workup I feel the patient has been appropriately medically screened and is safe for discharge home. Pertinent diagnoses were discussed with the patient. Patient was given return precautions.     Shon Baton, MD 05/25/15 [phone]

## 2016-01-25 ENCOUNTER — Emergency Department (HOSPITAL_COMMUNITY): Payer: BLUE CROSS/BLUE SHIELD

## 2016-01-25 ENCOUNTER — Emergency Department (HOSPITAL_COMMUNITY)
Admission: EM | Admit: 2016-01-25 | Discharge: 2016-01-25 | Disposition: A | Discharge status: Home / Self Care | Payer: BLUE CROSS/BLUE SHIELD | Attending: Emergency Medicine | Admitting: Emergency Medicine

## 2016-01-25 ENCOUNTER — Encounter (HOSPITAL_COMMUNITY): Payer: Self-pay

## 2016-01-25 DIAGNOSIS — J40 Bronchitis, not specified as acute or chronic: Secondary | ICD-10-CM | POA: Insufficient documentation

## 2016-01-25 DIAGNOSIS — I10 Essential (primary) hypertension: Secondary | ICD-10-CM | POA: Insufficient documentation

## 2016-01-25 DIAGNOSIS — R05 Cough: Secondary | ICD-10-CM | POA: Diagnosis not present

## 2016-01-25 HISTORY — DX: Disorder of thyroid, unspecified: E07.9

## 2016-01-25 MED ORDER — IPRATROPIUM-ALBUTEROL 0.5-2.5 (3) MG/3ML IN SOLN
3.0000 mL | Freq: Once | RESPIRATORY_TRACT | Status: AC
  Administered 2016-01-25: 3 mL via RESPIRATORY_TRACT
  Filled 2016-01-25: qty 3

## 2016-01-25 MED ORDER — ALBUTEROL SULFATE HFA 108 (90 BASE) MCG/ACT IN AERS
2.0000 | INHALATION_SPRAY | Freq: Four times a day (QID) | RESPIRATORY_TRACT | Status: DC | PRN
  Administered 2016-01-25: 2 via RESPIRATORY_TRACT
  Filled 2016-01-25: qty 6.7

## 2016-01-25 MED ORDER — BENZONATATE 200 MG PO CAPS: 200.0000 mg | ORAL_CAPSULE | Freq: Three times a day (TID) | ORAL | Status: DC | PRN

## 2016-01-25 MED ORDER — PREDNISONE 10 MG PO TABS: 20.0000 mg | ORAL_TABLET | Freq: Two times a day (BID) | ORAL | Status: DC

## 2016-01-25 NOTE — ED Notes (Signed)
Pt reports productive cough with green sputum and headache since Sunday.

## 2016-01-25 NOTE — Discharge Instructions (Signed)
How to Use an Inhaler °Proper inhaler technique is very important. Good technique ensures that the medicine reaches the lungs. Poor technique results in depositing the medicine on the tongue and back of the throat rather than in the airways. If you do not use the inhaler with good technique, the medicine will not help you. °STEPS TO FOLLOW IF USING AN INHALER WITHOUT AN EXTENSION TUBE °1. Remove the cap from the inhaler. °2. If you are using the inhaler for the first time, you will need to prime it. Shake the inhaler for 5 seconds and release four puffs into the air, away from your face. Ask your health care provider or pharmacist if you have questions about priming your inhaler. °3. Shake the inhaler for 5 seconds before each breath in (inhalation). °4. Position the inhaler so that the top of the canister faces up. °5. Put your index finger on the top of the medicine canister. Your thumb supports the bottom of the inhaler. °6. Open your mouth. °7. Either place the inhaler between your teeth and place your lips tightly around the mouthpiece, or hold the inhaler 1-2 inches away from your open mouth. If you are unsure of which technique to use, ask your health care provider. °8. Breathe out (exhale) normally and as completely as possible. °9. Press the canister down with your index finger to release the medicine. °10. At the same time as the canister is pressed, inhale deeply and slowly until your lungs are completely filled. This should take 4-6 seconds. Keep your tongue down. °11. Hold the medicine in your lungs for 5-10 seconds (10 seconds is best). This helps the medicine get into the small airways of your lungs. °12. Breathe out slowly, through pursed lips. Whistling is an example of pursed lips. °13. Wait at least 15-30 seconds between puffs. Continue with the above steps until you have taken the number of puffs your health care provider has ordered. Do not use the inhaler more than your health care provider  tells you. °14. Replace the cap on the inhaler. °15. Follow the directions from your health care provider or the inhaler insert for cleaning the inhaler. °STEPS TO FOLLOW IF USING AN INHALER WITH AN EXTENSION (SPACER) °1. Remove the cap from the inhaler. °2. If you are using the inhaler for the first time, you will need to prime it. Shake the inhaler for 5 seconds and release four puffs into the air, away from your face. Ask your health care provider or pharmacist if you have questions about priming your inhaler. °3. Shake the inhaler for 5 seconds before each breath in (inhalation). °4. Place the open end of the spacer onto the mouthpiece of the inhaler. °5. Position the inhaler so that the top of the canister faces up and the spacer mouthpiece faces you. °6. Put your index finger on the top of the medicine canister. Your thumb supports the bottom of the inhaler and the spacer. °7. Breathe out (exhale) normally and as completely as possible. °8. Immediately after exhaling, place the spacer between your teeth and into your mouth. Close your lips tightly around the spacer. °9. Press the canister down with your index finger to release the medicine. °10. At the same time as the canister is pressed, inhale deeply and slowly until your lungs are completely filled. This should take 4-6 seconds. Keep your tongue down and out of the way. °11. Hold the medicine in your lungs for 5-10 seconds (10 seconds is best). This helps the   medicine get into the small airways of your lungs. Exhale. °12. Repeat inhaling deeply through the spacer mouthpiece. Again hold that breath for up to 10 seconds (10 seconds is best). Exhale slowly. If it is difficult to take this second deep breath through the spacer, breathe normally several times through the spacer. Remove the spacer from your mouth. °13. Wait at least 15-30 seconds between puffs. Continue with the above steps until you have taken the number of puffs your health care provider has  ordered. Do not use the inhaler more than your health care provider tells you. °14. Remove the spacer from the inhaler, and place the cap on the inhaler. °15. Follow the directions from your health care provider or the inhaler insert for cleaning the inhaler and spacer. °If you are using different kinds of inhalers, use your quick relief medicine to open the airways 10-15 minutes before using a steroid if instructed to do so by your health care provider. If you are unsure which inhalers to use and the order of using them, ask your health care provider, nurse, or respiratory therapist. °If you are using a steroid inhaler, always rinse your mouth with water after your last puff, then gargle and spit out the water. Do not swallow the water. °AVOID: °· Inhaling before or after starting the spray of medicine. It takes practice to coordinate your breathing with triggering the spray. °· Inhaling through the nose (rather than the mouth) when triggering the spray. °HOW TO DETERMINE IF YOUR INHALER IS FULL OR NEARLY EMPTY °You cannot know when an inhaler is empty by shaking it. A few inhalers are now being made with dose counters. Ask your health care provider for a prescription that has a dose counter if you feel you need that extra help. If your inhaler does not have a counter, ask your health care provider to help you determine the date you need to refill your inhaler. Write the refill date on a calendar or your inhaler canister. Refill your inhaler 7-10 days before it runs out. Be sure to keep an adequate supply of medicine. This includes making sure it is not expired, and that you have a spare inhaler.  °SEEK MEDICAL CARE IF:  °· Your symptoms are only partially relieved with your inhaler. °· You are having trouble using your inhaler. °· You have some increase in phlegm. °SEEK IMMEDIATE MEDICAL CARE IF:  °· You feel little or no relief with your inhalers. You are still wheezing and are feeling shortness of breath or  tightness in your chest or both. °· You have dizziness, headaches, or a fast heart rate. °· You have chills, fever, or night sweats. °· You have a noticeable increase in phlegm production, or there is blood in the phlegm. °MAKE SURE YOU:  °· Understand these instructions. °· Will watch your condition. °· Will get help right away if you are not doing well or get worse. °  °This information is not intended to replace advice given to you by your health care provider. Make sure you discuss any questions you have with your health care provider. °  °Document Released: 09/08/2000 Document Revised: 07/02/2013 Document Reviewed: 04/10/2013 °Elsevier Interactive Patient Education ©2016 [employer] ° °

## 2016-01-25 NOTE — ED Provider Notes (Signed)
CSN: [id number]     Arrival date & time 01/25/16  1556 History   First MD Initiated Contact with Patient 01/25/16 1631     Chief Complaint  Patient presents with  . Cough     (Consider location/radiation/quality/duration/timing/severity/associated sxs/prior Treatment) Patient is a 25 y.o. male presenting with cough. The history is provided by the patient.  Cough Cough characteristics:  Productive Sputum characteristics:  Green Severity:  Moderate Onset quality:  Gradual Duration:  3 days Progression:  Worsening Chronicity:  New Smoker: no   Context: upper respiratory infection   Relieved by:  Nothing Worsened by:  Lying down Ineffective treatments: OTC meds. Associated symptoms: shortness of breath, sinus congestion, sore throat and wheezing    LEMARCUS Goodman is a 26 y.o. male who presents to the ED with cough, congestion and sore throat that started 3 days ago. OTC medications are not helping. The cough is worse at night when he is trying to sleep.  Past Medical History  Diagnosis Date  . Hypertension   . GERD (gastroesophageal reflux disease)   . Thyroid disease    Past Surgical History  Procedure Laterality Date  . Tonsillectomy     Family History  Problem Relation Age of Onset  . Hypertension Mother   . Hypertension Father   . Kidney disease Father   . Hypertension Other    Social History  Substance Use Topics  . Smoking status: Never Smoker   . Smokeless tobacco: Never Used  . Alcohol Use: 4.8 oz/week    8 Cans of beer per week     Comment: occas    Review of Systems  HENT: Positive for sore throat.   Respiratory: Positive for cough, shortness of breath and wheezing.   all other systems negative    Allergies  Codeine and Penicillins  Home Medications   Prior to Admission medications   Medication Sig Start Date End Date Taking? Authorizing Provider  benzonatate (TESSALON) 200 MG capsule Take 1 capsule (200 mg total) by mouth 3 (three) times  daily as needed for cough. 01/25/16   Randy Allsup Orlene Och, NP  enalapril (VASOTEC) 10 MG tablet Take 10 mg by mouth 2 (two) times daily.      Historical Provider, MD  hydrOXYzine (ATARAX/VISTARIL) 25 MG tablet Take 1 tablet (25 mg total) by mouth every 6 (six) hours. 06/24/14   Randy Poznanski Orlene Och, NP  loratadine (CLARITIN) 10 MG tablet Take 1 tablet (10 mg total) by mouth daily. 06/24/14   Randy Gebel Orlene Och, NP  omeprazole (PRILOSEC) 20 MG capsule Take 20 mg by mouth daily.    Historical Provider, MD  predniSONE (DELTASONE) 10 MG tablet Take 2 tablets (20 mg total) by mouth 2 (two) times daily with a meal. 01/25/16   Randy Baine Orlene Och, NP   BP 146/85 mmHg  Pulse 60  Temp(Src) 98.2 F (36.8 C) (Oral)  Resp 18  Ht 6' (1.829 m)  Wt 108.863 kg  BMI 32.54 kg/m2  SpO2 96% Physical Exam  Constitutional: He is oriented to person, place, and time. He appears well-developed and well-nourished.  HENT:  Head: Normocephalic and atraumatic.  Right Ear: Tympanic membrane normal.  Left Ear: Tympanic membrane normal.  Nose: Rhinorrhea present.  Mouth/Throat: Uvula is midline, oropharynx is clear and moist and mucous membranes are normal.  Eyes: EOM are normal.  Neck: Neck supple.  Cardiovascular: Normal rate and regular rhythm.   Pulmonary/Chest: Effort normal. He has decreased breath sounds in the right middle field and  the right lower field. He has no rhonchi. He has no rales.  Abdominal: Soft. There is no tenderness.  Musculoskeletal: Normal range of motion.  Lymphadenopathy:    He has no cervical adenopathy.  Neurological: He is alert and oriented to person, place, and time. No cranial nerve deficit.  Skin: Skin is warm and dry.  Psychiatric: He has a normal mood and affect. His behavior is normal.  Nursing note and vitals reviewed.   ED Course  Procedures (including critical care time) X-ray, Duoneb Albuterol inhaler to go home with patient with instruction by Respiratory Therapy  Re examined after Neb treatment  and air movement has improved, patient feeling some better.   Labs Review Labs Reviewed - No data to display  Imaging Review Dg Chest 2 View  01/25/2016  CLINICAL DATA:  Patient with productive cough, headache and sore throat for 2 days. EXAM: CHEST  2 VIEW COMPARISON:  05/25/2015 FINDINGS: The heart size and mediastinal contours are within normal limits. Both lungs are clear. No pleural effusion or pneumothorax. The visualized skeletal structures are unremarkable. IMPRESSION: Normal chest radiographs. Electronically Signed   By: Amie Portlandavid  Ormond M.D.   On: 01/25/2016 16:57    MDM  25 y.o. male with cough and congestion x 3 days stable for d/c without fever or respiratory distress. O2 AT 96% on R/A. Normal CXR. Discussed with the patient clinical and x-ray findings and plan of care. All questioned fully answered. He will return if any problems arise.   Final diagnoses:  Bronchitis       [name], NP 01/25/16 1754  [name], MD 01/26/16 1125

## 2016-06-09 DIAGNOSIS — I10 Essential (primary) hypertension: Secondary | ICD-10-CM | POA: Diagnosis not present

## 2016-06-09 DIAGNOSIS — Z6833 Body mass index (BMI) 33.0-33.9, adult: Secondary | ICD-10-CM | POA: Diagnosis not present

## 2016-06-09 DIAGNOSIS — F419 Anxiety disorder, unspecified: Secondary | ICD-10-CM | POA: Diagnosis not present

## 2016-06-09 DIAGNOSIS — Z1389 Encounter for screening for other disorder: Secondary | ICD-10-CM | POA: Diagnosis not present

## 2016-06-09 DIAGNOSIS — J069 Acute upper respiratory infection, unspecified: Secondary | ICD-10-CM | POA: Diagnosis not present

## 2016-08-10 DIAGNOSIS — R1310 Dysphagia, unspecified: Secondary | ICD-10-CM | POA: Diagnosis not present

## 2016-08-10 DIAGNOSIS — E782 Mixed hyperlipidemia: Secondary | ICD-10-CM | POA: Diagnosis not present

## 2016-08-10 DIAGNOSIS — Z1389 Encounter for screening for other disorder: Secondary | ICD-10-CM | POA: Diagnosis not present

## 2016-08-10 DIAGNOSIS — E663 Overweight: Secondary | ICD-10-CM | POA: Diagnosis not present

## 2016-08-10 DIAGNOSIS — Z6833 Body mass index (BMI) 33.0-33.9, adult: Secondary | ICD-10-CM | POA: Diagnosis not present

## 2016-08-10 DIAGNOSIS — I10 Essential (primary) hypertension: Secondary | ICD-10-CM | POA: Diagnosis not present

## 2016-08-15 ENCOUNTER — Encounter (INDEPENDENT_AMBULATORY_CARE_PROVIDER_SITE_OTHER): Payer: Self-pay | Admitting: Internal Medicine

## 2016-08-15 ENCOUNTER — Encounter (INDEPENDENT_AMBULATORY_CARE_PROVIDER_SITE_OTHER): Payer: Self-pay

## 2016-08-29 ENCOUNTER — Encounter (INDEPENDENT_AMBULATORY_CARE_PROVIDER_SITE_OTHER): Payer: Self-pay | Admitting: Internal Medicine

## 2016-08-29 ENCOUNTER — Ambulatory Visit (INDEPENDENT_AMBULATORY_CARE_PROVIDER_SITE_OTHER): Payer: BLUE CROSS/BLUE SHIELD | Admitting: Internal Medicine

## 2017-01-08 DIAGNOSIS — J019 Acute sinusitis, unspecified: Secondary | ICD-10-CM | POA: Diagnosis not present

## 2017-01-08 DIAGNOSIS — R05 Cough: Secondary | ICD-10-CM | POA: Diagnosis not present

## 2017-01-08 DIAGNOSIS — J209 Acute bronchitis, unspecified: Secondary | ICD-10-CM | POA: Diagnosis not present

## 2017-03-09 DIAGNOSIS — E6609 Other obesity due to excess calories: Secondary | ICD-10-CM | POA: Diagnosis not present

## 2017-03-09 DIAGNOSIS — E782 Mixed hyperlipidemia: Secondary | ICD-10-CM | POA: Diagnosis not present

## 2017-03-09 DIAGNOSIS — Z1389 Encounter for screening for other disorder: Secondary | ICD-10-CM | POA: Diagnosis not present

## 2017-03-09 DIAGNOSIS — Z6834 Body mass index (BMI) 34.0-34.9, adult: Secondary | ICD-10-CM | POA: Diagnosis not present

## 2017-03-09 DIAGNOSIS — I10 Essential (primary) hypertension: Secondary | ICD-10-CM | POA: Diagnosis not present

## 2017-08-03 DIAGNOSIS — E6609 Other obesity due to excess calories: Secondary | ICD-10-CM | POA: Diagnosis not present

## 2017-08-03 DIAGNOSIS — F419 Anxiety disorder, unspecified: Secondary | ICD-10-CM | POA: Diagnosis not present

## 2017-08-03 DIAGNOSIS — Z1389 Encounter for screening for other disorder: Secondary | ICD-10-CM | POA: Diagnosis not present

## 2017-08-03 DIAGNOSIS — I10 Essential (primary) hypertension: Secondary | ICD-10-CM | POA: Diagnosis not present

## 2017-08-03 DIAGNOSIS — Z6835 Body mass index (BMI) 35.0-35.9, adult: Secondary | ICD-10-CM | POA: Diagnosis not present

## 2017-08-03 DIAGNOSIS — E782 Mixed hyperlipidemia: Secondary | ICD-10-CM | POA: Diagnosis not present

## 2017-10-17 DIAGNOSIS — Z1389 Encounter for screening for other disorder: Secondary | ICD-10-CM | POA: Diagnosis not present

## 2017-10-17 DIAGNOSIS — E6609 Other obesity due to excess calories: Secondary | ICD-10-CM | POA: Diagnosis not present

## 2017-10-17 DIAGNOSIS — J22 Unspecified acute lower respiratory infection: Secondary | ICD-10-CM | POA: Diagnosis not present

## 2017-10-17 DIAGNOSIS — Z6835 Body mass index (BMI) 35.0-35.9, adult: Secondary | ICD-10-CM | POA: Diagnosis not present

## 2017-10-17 DIAGNOSIS — R6889 Other general symptoms and signs: Secondary | ICD-10-CM | POA: Diagnosis not present

## 2018-02-20 ENCOUNTER — Emergency Department (HOSPITAL_COMMUNITY): Payer: BLUE CROSS/BLUE SHIELD

## 2018-02-20 ENCOUNTER — Encounter (HOSPITAL_COMMUNITY): Payer: Self-pay | Admitting: Emergency Medicine

## 2018-02-20 ENCOUNTER — Emergency Department (HOSPITAL_COMMUNITY)
Admission: EM | Admit: 2018-02-20 | Discharge: 2018-02-20 | Disposition: A | Discharge status: Home / Self Care | Payer: BLUE CROSS/BLUE SHIELD | Attending: Emergency Medicine | Admitting: Emergency Medicine

## 2018-02-20 DIAGNOSIS — I10 Essential (primary) hypertension: Secondary | ICD-10-CM | POA: Diagnosis not present

## 2018-02-20 DIAGNOSIS — F17210 Nicotine dependence, cigarettes, uncomplicated: Secondary | ICD-10-CM | POA: Diagnosis not present

## 2018-02-20 DIAGNOSIS — R0789 Other chest pain: Secondary | ICD-10-CM | POA: Diagnosis not present

## 2018-02-20 DIAGNOSIS — Z79899 Other long term (current) drug therapy: Secondary | ICD-10-CM | POA: Insufficient documentation

## 2018-02-20 DIAGNOSIS — R0602 Shortness of breath: Secondary | ICD-10-CM | POA: Diagnosis not present

## 2018-02-20 DIAGNOSIS — R079 Chest pain, unspecified: Secondary | ICD-10-CM | POA: Insufficient documentation

## 2018-02-20 LAB — CBC
HCT: 50.9 % (ref 39.0–52.0)
Hemoglobin: 17.2 g/dL — ABNORMAL HIGH (ref 13.0–17.0)
MCH: 32.3 pg (ref 26.0–34.0)
MCHC: 33.8 g/dL (ref 30.0–36.0)
MCV: 95.7 fL (ref 78.0–100.0)
Platelets: 334 10*3/uL (ref 150–400)
RBC: 5.32 MIL/uL (ref 4.22–5.81)
RDW: 12.9 % (ref 11.5–15.5)
WBC: 8.1 10*3/uL (ref 4.0–10.5)

## 2018-02-20 LAB — TROPONIN I
Troponin I: <0.03 ng/mL
Troponin I: <0.03 ng/mL

## 2018-02-20 LAB — BASIC METABOLIC PANEL
Anion gap: 11 (ref 5–15)
BUN: 8 mg/dL (ref 6–20)
CO2: 26 mmol/L (ref 22–32)
Calcium: 9.7 mg/dL (ref 8.9–10.3)
Chloride: 101 mmol/L (ref 101–111)
Creatinine, Ser: 0.86 mg/dL (ref 0.61–1.24)
GFR calc Af Amer: >60 mL/min (ref >60)
GFR calc non Af Amer: >60 mL/min (ref >60)
Glucose, Bld: 106 mg/dL — ABNORMAL HIGH (ref 65–99)
Potassium: 4.6 mmol/L (ref 3.5–5.1)
Sodium: 138 mmol/L (ref 135–145)

## 2018-02-20 NOTE — ED Triage Notes (Addendum)
Patient complains of left chest pain that radiates towards left arm x 1 day. States numbness in left arm at times with worsening discomfort when walking.

## 2018-02-20 NOTE — ED Provider Notes (Signed)
[facility] EMERGENCY DEPARTMENT Provider Note   CSN: [id number] Arrival date & time: 02/20/18  1508     History   Chief Complaint Chief Complaint  Patient presents with  . Chest Pain    HPI Randy Goodman is a 27 y.o. male.  HPI   27 year old male with left chest pain with radiation to his left arm.  Onset yesterday.  Persistent since then.  Pain is constant but does feel somewhat worse with walking.  Also worse with certain movements and when pressing over his left anterior chest.  Denies any new activity, trauma or strain.  He has no acute respiratory complaints.  No fevers or chills.  No unusual leg pain or swelling.  Past Medical History:  Diagnosis Date  . GERD (gastroesophageal reflux disease)   . Hypertension   . Thyroid disease     There are no active problems to display for this patient.   Past Surgical History:  Procedure Laterality Date  . TONSILLECTOMY          Home Medications    Prior to Admission medications   Medication Sig Start Date End Date Taking? Authorizing Provider  benzonatate (TESSALON) 200 MG capsule Take 1 capsule (200 mg total) by mouth 3 (three) times daily as needed for cough. 01/25/16   Janne Napoleon, NP  enalapril (VASOTEC) 10 MG tablet Take 10 mg by mouth 2 (two) times daily.      [provider]  hydrOXYzine (ATARAX/VISTARIL) 25 MG tablet Take 1 tablet (25 mg total) by mouth every 6 (six) hours. 06/24/14   Janne Napoleon, NP  loratadine (CLARITIN) 10 MG tablet Take 1 tablet (10 mg total) by mouth daily. 06/24/14   Janne Napoleon, NP  omeprazole (PRILOSEC) 20 MG capsule Take 20 mg by mouth daily.    [provider]  predniSONE (DELTASONE) 10 MG tablet Take 2 tablets (20 mg total) by mouth 2 (two) times daily with a meal. 01/25/16   Janne Napoleon, NP    Family History Family History  Problem Relation Age of Onset  . Hypertension Mother   . Hypertension Father   . Kidney disease Father   . Hypertension Other      Social History Social History   Tobacco Use  . Smoking status: Current Every Day Smoker    Packs/day: 1.00    Types: Cigarettes  . Smokeless tobacco: Never Used  Substance Use Topics  . Alcohol use: Yes    Alcohol/week: 4.8 oz    Types: 8 Cans of beer per week    Comment: occas  . Drug use: No     Allergies   Codeine and Penicillins   Review of Systems Review of Systems   Physical Exam Updated Vital Signs BP (!) 141/95 (BP Location: Right Arm)   Pulse 78   Temp 98.3 F (36.8 C) (Oral)   Resp 16   Ht  (1.854 m)   Wt 117.9 kg (260 lb)   SpO2 98%   BMI 34.30 kg/m   Physical Exam  Constitutional: He appears well-developed and well-nourished. No distress.  HENT:  Head: Normocephalic and atraumatic.  Eyes: Conjunctivae are normal. Right eye exhibits no discharge. Left eye exhibits no discharge.  Neck: Neck supple.  Cardiovascular: Normal rate, regular rhythm and normal heart sounds. Exam reveals no gallop and no friction rub.  No murmur heard. Pulmonary/Chest: Effort normal and breath [facility]. No respiratory distress.  Abdominal: Soft. He exhibits no distension. There  is no tenderness.  Musculoskeletal: He exhibits no edema or tenderness.  Lower extremities symmetric as compared to each other. No calf tenderness. Negative Homan's. No palpable cords.   Neurological: He is alert.  Skin: Skin is warm and dry.  Psychiatric: He has a normal mood and affect. His behavior is normal. Thought content normal.  Nursing note and vitals reviewed.    ED Treatments / Results  Labs (all labs ordered are listed, but only abnormal results are displayed) Labs Reviewed  BASIC METABOLIC PANEL - Abnormal; Notable for the following components:      Result Value   Glucose, Bld 106 (*)    All other components within normal limits  CBC - Abnormal; Notable for the following components:   Hemoglobin 17.2 (*)    All other components within normal limits  TROPONIN I   TROPONIN I    EKG EKG Interpretation  Date/Time:  Wednesday Feb 20 2018 15:28:40 EDT Ventricular Rate:  83 PR Interval:  140 QRS Duration: 90 QT Interval:  352 QTC Calculation: 413 R Axis:   84 Text Interpretation:  Normal sinus rhythm No significant change since last tracing Confirmed by Raeford Razor [phone]) on 02/20/2018 7:46:33 PM   Radiology Dg Chest 2 View  Result Date: 02/20/2018 CLINICAL DATA:  Chest pain and shortness of breath EXAM: CHEST - 2 VIEW COMPARISON:  Jan 25, 2016 FINDINGS: No edema or consolidation. Heart size and pulmonary vascularity are normal. No adenopathy. No pneumothorax. No bone lesions. IMPRESSION: No edema or consolidation. Electronically Signed   By: Bretta Bang III M.D.   On: 02/20/2018 15:59    Procedures Procedures (including critical care time)  Medications Ordered in ED Medications - No data to display   Initial Impression / Assessment and Plan / ED Course  I have reviewed the triage vital signs and the nursing notes.  Pertinent labs & imaging results that were available during my care of the patient were reviewed by me and considered in my medical decision making (see chart for details).     27 year old male with chest pain.  Atypical for ACS.  Doubt PE, dissection or other emergent process.  His EKG is not acutely changed from priors.  Troponin normal x2.  Chest x-ray without acute abnormality.  He is afebrile.  He dynamically stable.  May be an anxiety component to this.  Regardless, I doubt emergent etiology.  Final Clinical Impressions(s) / ED Diagnoses   Final diagnoses:  Chest pain, unspecified type    ED Discharge Orders    None       Raeford Razor, MD 02/22/18 Barry Brunner

## 2018-02-28 DIAGNOSIS — Z6836 Body mass index (BMI) 36.0-36.9, adult: Secondary | ICD-10-CM | POA: Diagnosis not present

## 2018-02-28 DIAGNOSIS — E6609 Other obesity due to excess calories: Secondary | ICD-10-CM | POA: Diagnosis not present

## 2018-02-28 DIAGNOSIS — F419 Anxiety disorder, unspecified: Secondary | ICD-10-CM | POA: Diagnosis not present

## 2018-02-28 DIAGNOSIS — E063 Autoimmune thyroiditis: Secondary | ICD-10-CM | POA: Diagnosis not present

## 2018-02-28 DIAGNOSIS — Z1389 Encounter for screening for other disorder: Secondary | ICD-10-CM | POA: Diagnosis not present

## 2018-02-28 DIAGNOSIS — R7309 Other abnormal glucose: Secondary | ICD-10-CM | POA: Diagnosis not present

## 2019-07-30 ENCOUNTER — Ambulatory Visit (INDEPENDENT_AMBULATORY_CARE_PROVIDER_SITE_OTHER): Payer: Self-pay

## 2019-07-30 ENCOUNTER — Ambulatory Visit
Admission: EM | Admit: 2019-07-30 | Discharge: 2019-07-30 | Disposition: A | Discharge status: Home / Self Care | Payer: Self-pay | Attending: Emergency Medicine | Admitting: Emergency Medicine

## 2019-07-30 ENCOUNTER — Other Ambulatory Visit: Payer: Self-pay

## 2019-07-30 DIAGNOSIS — M79645 Pain in left finger(s): Secondary | ICD-10-CM

## 2019-07-30 DIAGNOSIS — S62629A Displaced fracture of medial phalanx of unspecified finger, initial encounter for closed fracture: Secondary | ICD-10-CM

## 2019-07-30 DIAGNOSIS — Z87821 Personal history of retained foreign body fully removed: Secondary | ICD-10-CM

## 2019-07-30 DIAGNOSIS — Y990 Civilian activity done for income or pay: Secondary | ICD-10-CM

## 2019-07-30 DIAGNOSIS — S60453A Superficial foreign body of left middle finger, initial encounter: Secondary | ICD-10-CM

## 2019-07-30 DIAGNOSIS — S6992XA Unspecified injury of left wrist, hand and finger(s), initial encounter: Secondary | ICD-10-CM

## 2019-07-30 MED ORDER — IBUPROFEN 800 MG PO TABS: 800.0000 mg | ORAL_TABLET | Freq: Three times a day (TID) | ORAL | 0 refills | Status: AC

## 2019-07-30 NOTE — ED Triage Notes (Signed)
Pt presents with injury to left 2nd left finger that he struck with large hammer, finger is swollen

## 2019-07-30 NOTE — ED Provider Notes (Signed)
[facility]   [id number] 07/30/19 Arrival Time: 1813  CC: Finger injury; work injury  SUBJECTIVE: History from: patient. Daryel NovemberMatthew S Geving is a 28 y.o. male complains of LEFT pointer finger injury that occurred 3 hours ago.  Symptoms began after smashing a hammer on his pointer finger.  Was working in normal capacity as a Teacher, adult educationconcrete pump operator.  Localizes the pain to the left pointer finger.  Describes the pain as constant and throbbing in character.  Has NOT tried OTC medications or cleaning.  Symptoms are made worse to the touch and bending finger.  Denies similar symptoms in the past.  Complains of associated swelling, and mild bleeding.  Denies fever, chills, erythema, ecchymosis, weakness, numbness and tingling.    Last tetanus within the last couple of years.    ROS: As per HPI.  All other pertinent ROS negative.     Past Medical History:  Diagnosis Date   GERD (gastroesophageal reflux disease)    Hypertension    Thyroid disease    Past Surgical History:  Procedure Laterality Date   TONSILLECTOMY     Allergies  Allergen Reactions   Codeine Palpitations   Penicillins Hives and Rash    Has patient had a PCN reaction causing immediate rash, facial/tongue/throat swelling, SOB or lightheadedness with hypotension: Yes Has patient had a PCN reaction causing severe rash involving mucus membranes or skin necrosis: Unknown Has patient had a PCN reaction that required hospitalization: Unknown Has patient had a PCN reaction occurring within the last 10 years: Unknown If all of the above answers are "NO", then may proceed with Cephalosporin use.    No current facility-administered medications on file prior to encounter.    Current Outpatient Medications on File Prior to Encounter  Medication Sig Dispense Refill   albuterol (PROAIR HFA) 108 (90 Base) MCG/ACT inhaler INHALE 2 PUFF(S) EVERY 4 HOURS BY INHALATION ROUTE AS NEEDED FOR WHEEZING/SHORTNESS OF BREATH      ALPRAZolam (XANAX) 0.5 MG tablet Take 0.5 mg by mouth 2 (two) times daily as needed.  3   escitalopram (LEXAPRO) 10 MG tablet Take 20 mg by mouth daily.   2   levothyroxine (SYNTHROID, LEVOTHROID) 50 MCG tablet Take 50 mcg by mouth daily.  3   olmesartan (BENICAR) 40 MG tablet Take 40 mg by mouth daily.  5   pantoprazole (PROTONIX) 40 MG tablet Take 40 mg by mouth 2 (two) times daily.     Social History   Socioeconomic History   Marital status: Married    Spouse name: Not on file   Number of children: Not on file   Years of education: Not on file   Highest education level: Not on file  Occupational History   Not on file  Social Needs   Financial resource strain: Not on file   Food insecurity    Worry: Not on file    Inability: Not on file   Transportation needs    Medical: Not on file    Non-medical: Not on file  Tobacco Use   Smoking status: Current Every Day Smoker    Packs/day: 1.00    Types: Cigarettes   Smokeless tobacco: Never Used  Substance and Sexual Activity   Alcohol use: Yes    Alcohol/week: 8.0 standard drinks    Types: 8 Cans of beer per week    Comment: occas   Drug use: No   Sexual activity: Yes    Birth control/protection: None  Lifestyle   Physical activity  Days per week: Not on file    Minutes per session: Not on file   Stress: Not on file  Relationships   Social connections    Talks on phone: Not on file    Gets together: Not on file    Attends religious service: Not on file    Active member of club or organization: Not on file    Attends meetings of clubs or organizations: Not on file    Relationship status: Not on file   Intimate partner violence    Fear of current or ex partner: Not on file    Emotionally abused: Not on file    Physically abused: Not on file    Forced sexual activity: Not on file  Other Topics Concern   Not on file  Social History Narrative   Not on file   Family History  Problem Relation  Age of Onset   Hypertension Mother    Hypertension Father    Kidney disease Father    Hypertension Other     OBJECTIVE:  Vitals:   07/30/19 1826  BP: (!) 143/91  Pulse: 78  Resp: 18  Temp: 98.3 F (36.8 C)  SpO2: 95%    General appearance: ALERT; in no acute distress.  Head: NCAT Lungs: Normal respiratory effort CV: Radial pulses 2+ . Cap refill < 2 seconds Musculoskeletal: Left hand Inspection: Small punctate wound to anterior and posterior middle phalanx  Palpation: TTP over middle and distal phalanx second digit left hand ROM: LROM about the second digit Strength: deferred grip strenght Skin: warm and dry Neurologic: Ambulates without difficulty; Sensation intact about the upper extremities Psychological: alert and cooperative; normal mood and affect  DIAGNOSTIC STUDIES:  Dg Hand Complete Left  Result Date: 07/30/2019 CLINICAL DATA:  Smashed hand with hammer. EXAM: LEFT HAND - COMPLETE 3+ VIEW COMPARISON:  None. FINDINGS: There is a nondisplaced fracture through the middle phalanx of the left index finger. Small radiopaque foreign body within the anterior soft tissues overlying the middle phalanx. No additional fracture, subluxation or dislocation. Joint spaces maintained. IMPRESSION: Left index finger middle phalangeal fracture, nondisplaced. Small radiopaque foreign body within the soft tissues overlying the fracture. Electronically Signed   By: Rolm Baptise M.D.   On: 07/30/2019 19:06   Dg Finger Index Left  Result Date: 07/30/2019 CLINICAL DATA:  28 year old male with a history of foreign body removal EXAM: LEFT INDEX FINGER 2+V COMPARISON:  None. FINDINGS: The previous geometric foreign body within the volar aspect of the first finger is no longer visualized. Redemonstration of nondisplaced fracture in the middle phalanx. IMPRESSION: The geometric radiopaque foreign body is no longer visualized. Redemonstration of nondisplaced middle phalanx fracture. Electronically  Signed   By: Corrie Mckusick D.O.   On: 07/30/2019 19:25     X-rays positive for middle phalanx fracture of second digit left hand.     I have reviewed the x-rays myself and the radiologist interpretation. I am in agreement with the radiologist interpretation.     ASSESSMENT & PLAN:  1. Fracture of middle phalanx of finger of left hand   2. Injury of finger of left hand, initial encounter   3. Work related injury      Meds ordered this encounter  Medications   ibuprofen (ADVIL) 800 MG tablet    Sig: Take 1 tablet (800 mg total) by mouth 3 (three) times daily.    Dispense:  21 tablet    Refill:  0    Order Specific  Question:   Supervising Provider    Answer:   Eustace Moore [[id number]]   Had patient wash hands and soak in iodine.   X-rays show fracture of middle phalanx of left hand, second digit Splint applied Continue conservative management of rest, ice, and elevation Take ibuprofen 800 mg as needed for pain relief (may cause abdominal discomfort, ulcers, and GI bleeds avoid taking with other NSAIDs or with lexapro) Follow up with orthopedist for further evaluation and management Return or go to the ER if you have any new or worsening symptoms (fever, chills, swelling, increased pain, discharge, bleeding, symptoms do not improve with treatment, etc...)   Reviewed expectations re: course of current medical issues. Questions answered. Outlined signs and symptoms indicating need for more acute intervention. Patient verbalized understanding. After Visit Summary given.    Rennis Harding, PA-C 07/30/19 1956

## 2019-07-30 NOTE — Discharge Instructions (Addendum)
X-rays show fracture of middle phalanx of left hand, second digit Splint applied Continue conservative management of rest, ice, and elevation Take ibuprofen 800 mg as needed for pain relief (may cause abdominal discomfort, ulcers, and GI bleeds avoid taking with other NSAIDs or with lexapro) Follow up with orthopedist for further evaluation and management Return or go to the ER if you have any new or worsening symptoms (fever, chills, swelling, increased pain, discharge, bleeding, symptoms do not improve with treatment, etc...)

## 2021-01-04 ENCOUNTER — Other Ambulatory Visit: Payer: Self-pay | Admitting: Physician Assistant

## 2021-01-04 DIAGNOSIS — R1314 Dysphagia, pharyngoesophageal phase: Secondary | ICD-10-CM

## 2021-01-04 DIAGNOSIS — K219 Gastro-esophageal reflux disease without esophagitis: Secondary | ICD-10-CM | POA: Diagnosis not present

## 2021-01-25 ENCOUNTER — Ambulatory Visit
Admission: RE | Admit: 2021-01-25 | Discharge: 2021-01-25 | Disposition: A | Discharge status: Home / Self Care | Payer: BC Managed Care – PPO | Source: Ambulatory Visit | Attending: Physician Assistant | Admitting: Physician Assistant

## 2021-01-25 DIAGNOSIS — R1314 Dysphagia, pharyngoesophageal phase: Secondary | ICD-10-CM

## 2021-01-25 DIAGNOSIS — R1310 Dysphagia, unspecified: Secondary | ICD-10-CM | POA: Diagnosis not present

## 2021-01-27 DIAGNOSIS — R1310 Dysphagia, unspecified: Secondary | ICD-10-CM | POA: Diagnosis not present

## 2021-01-27 DIAGNOSIS — R933 Abnormal findings on diagnostic imaging of other parts of digestive tract: Secondary | ICD-10-CM | POA: Diagnosis not present

## 2021-01-27 DIAGNOSIS — K2930 Chronic superficial gastritis without bleeding: Secondary | ICD-10-CM | POA: Diagnosis not present

## 2021-01-27 DIAGNOSIS — K2289 Other specified disease of esophagus: Secondary | ICD-10-CM | POA: Diagnosis not present

## 2021-01-27 DIAGNOSIS — K449 Diaphragmatic hernia without obstruction or gangrene: Secondary | ICD-10-CM | POA: Diagnosis not present

## 2021-01-27 DIAGNOSIS — K222 Esophageal obstruction: Secondary | ICD-10-CM | POA: Diagnosis not present

## 2021-11-25 DIAGNOSIS — F419 Anxiety disorder, unspecified: Secondary | ICD-10-CM | POA: Diagnosis not present

## 2021-11-25 DIAGNOSIS — J069 Acute upper respiratory infection, unspecified: Secondary | ICD-10-CM | POA: Diagnosis not present

## 2022-07-04 DIAGNOSIS — E039 Hypothyroidism, unspecified: Secondary | ICD-10-CM | POA: Diagnosis not present

## 2022-07-04 DIAGNOSIS — Z6835 Body mass index (BMI) 35.0-35.9, adult: Secondary | ICD-10-CM | POA: Diagnosis not present

## 2022-07-04 DIAGNOSIS — J111 Influenza due to unidentified influenza virus with other respiratory manifestations: Secondary | ICD-10-CM | POA: Diagnosis not present

## 2022-07-04 DIAGNOSIS — I10 Essential (primary) hypertension: Secondary | ICD-10-CM | POA: Diagnosis not present

## 2022-07-04 DIAGNOSIS — F419 Anxiety disorder, unspecified: Secondary | ICD-10-CM | POA: Diagnosis not present

## 2022-07-04 DIAGNOSIS — E6609 Other obesity due to excess calories: Secondary | ICD-10-CM | POA: Diagnosis not present

## 2022-07-15 DIAGNOSIS — E669 Obesity, unspecified: Secondary | ICD-10-CM | POA: Diagnosis not present

## 2022-07-15 DIAGNOSIS — I10 Essential (primary) hypertension: Secondary | ICD-10-CM | POA: Diagnosis not present

## 2022-07-15 DIAGNOSIS — R051 Acute cough: Secondary | ICD-10-CM | POA: Diagnosis not present

## 2022-07-15 DIAGNOSIS — Z6836 Body mass index (BMI) 36.0-36.9, adult: Secondary | ICD-10-CM | POA: Diagnosis not present

## 2022-07-26 DIAGNOSIS — K219 Gastro-esophageal reflux disease without esophagitis: Secondary | ICD-10-CM | POA: Diagnosis not present

## 2022-07-26 DIAGNOSIS — Z6835 Body mass index (BMI) 35.0-35.9, adult: Secondary | ICD-10-CM | POA: Diagnosis not present

## 2022-07-26 DIAGNOSIS — Z0000 Encounter for general adult medical examination without abnormal findings: Secondary | ICD-10-CM | POA: Diagnosis not present

## 2022-07-26 DIAGNOSIS — R7309 Other abnormal glucose: Secondary | ICD-10-CM | POA: Diagnosis not present

## 2022-07-26 DIAGNOSIS — I10 Essential (primary) hypertension: Secondary | ICD-10-CM | POA: Diagnosis not present

## 2022-07-26 DIAGNOSIS — F419 Anxiety disorder, unspecified: Secondary | ICD-10-CM | POA: Diagnosis not present

## 2022-07-26 DIAGNOSIS — E6609 Other obesity due to excess calories: Secondary | ICD-10-CM | POA: Diagnosis not present

## 2022-07-26 DIAGNOSIS — E039 Hypothyroidism, unspecified: Secondary | ICD-10-CM | POA: Diagnosis not present

## 2022-09-23 DIAGNOSIS — R197 Diarrhea, unspecified: Secondary | ICD-10-CM | POA: Diagnosis not present

## 2022-09-23 DIAGNOSIS — K529 Noninfective gastroenteritis and colitis, unspecified: Secondary | ICD-10-CM | POA: Diagnosis not present

## 2022-09-23 DIAGNOSIS — Z20822 Contact with and (suspected) exposure to covid-19: Secondary | ICD-10-CM | POA: Diagnosis not present

## 2022-09-23 DIAGNOSIS — Z880 Allergy status to penicillin: Secondary | ICD-10-CM | POA: Diagnosis not present

## 2022-09-23 DIAGNOSIS — F17210 Nicotine dependence, cigarettes, uncomplicated: Secondary | ICD-10-CM | POA: Diagnosis not present

## 2022-09-23 DIAGNOSIS — E860 Dehydration: Secondary | ICD-10-CM | POA: Diagnosis not present

## 2022-09-23 DIAGNOSIS — R109 Unspecified abdominal pain: Secondary | ICD-10-CM | POA: Diagnosis not present

## 2023-02-13 DIAGNOSIS — F419 Anxiety disorder, unspecified: Secondary | ICD-10-CM | POA: Diagnosis not present

## 2023-02-13 DIAGNOSIS — K219 Gastro-esophageal reflux disease without esophagitis: Secondary | ICD-10-CM | POA: Diagnosis not present

## 2023-03-19 IMAGING — RF DG ESOPHAGUS
8 series · 14 of 24 positions shown · non-contrast
Comparison: 12/07/2012 CT abdomen/pelvis.

CLINICAL DATA: Pharyngo esophageal dysphagia with globus sensation
in the throat.

EXAM:
ESOPHOGRAM / BARIUM SWALLOW / BARIUM TABLET STUDY
TECHNIQUE: Combined double contrast and single contrast examination performed
using effervescent crystals, thick barium liquid, and thin barium
liquid. The patient was observed with fluoroscopy swallowing a 13 mm
barium sulphate tablet.
FLUOROSCOPY TIME:  Fluoroscopy Time:  3 minutes 6 seconds
Radiation Exposure Index (if provided by the fluoroscopic device):
60 mGy
Number of Acquired Spot Images: 8

[Series 1: sequence · 2 of 19 frames shown (1 of 6)]
[frame 3/19]
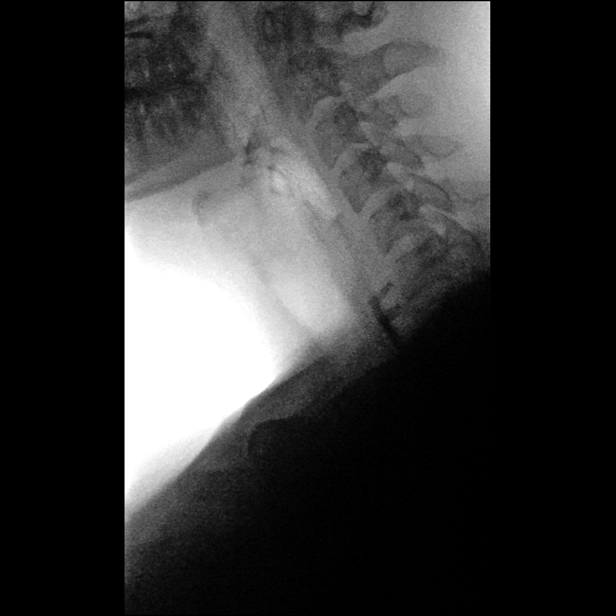
[frame 17/19]
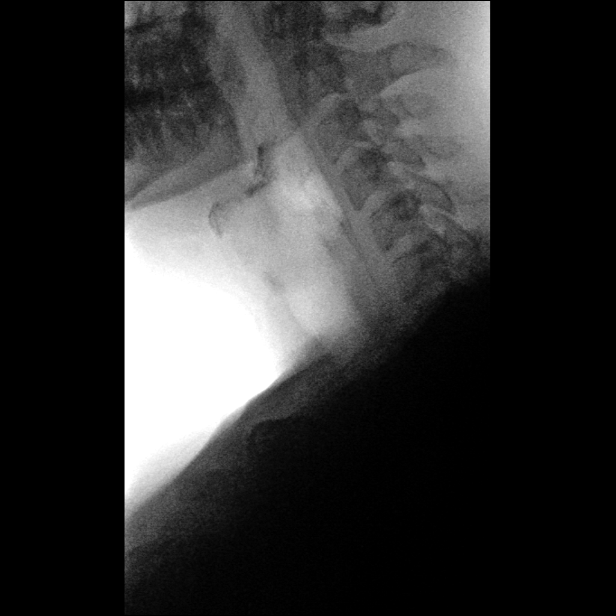

[Series 2: sequence · 1 of 16 frames shown (2 of 6)]
[frame 9/16]
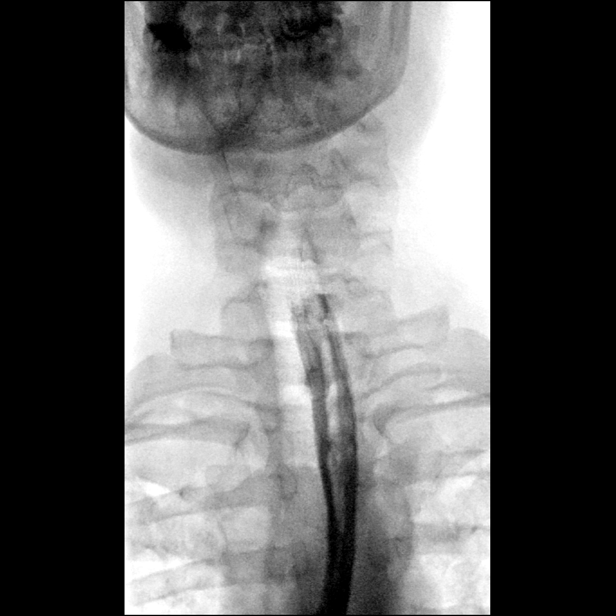

[Series 3: one shot · 0.15mm/px · 3 of 8 slices shown (1 of 2)]
[im 2/8]
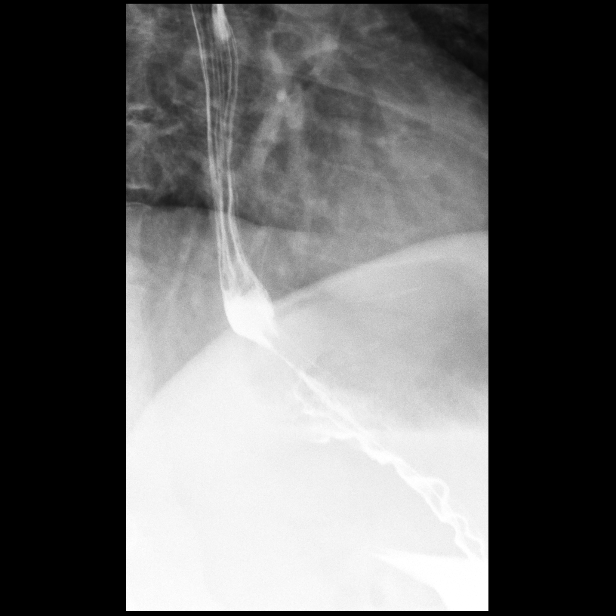
[im 3/8]
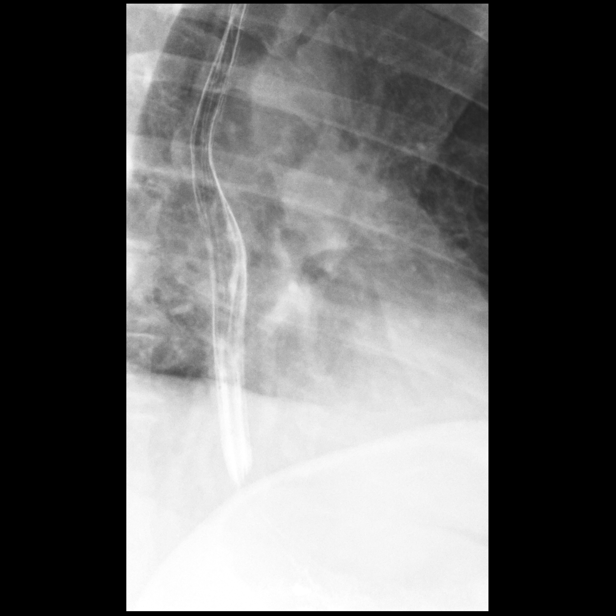
[im 6/8]
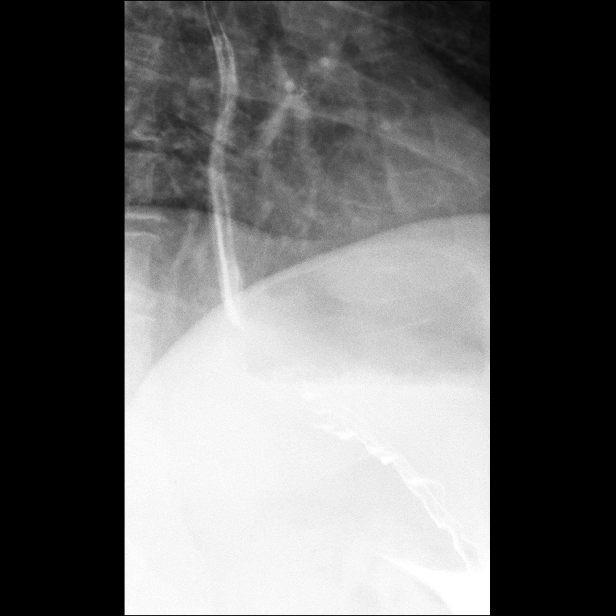

[Series 4: sequence · 2 of 45 frames shown (3 of 6)]
[frame 7/45]
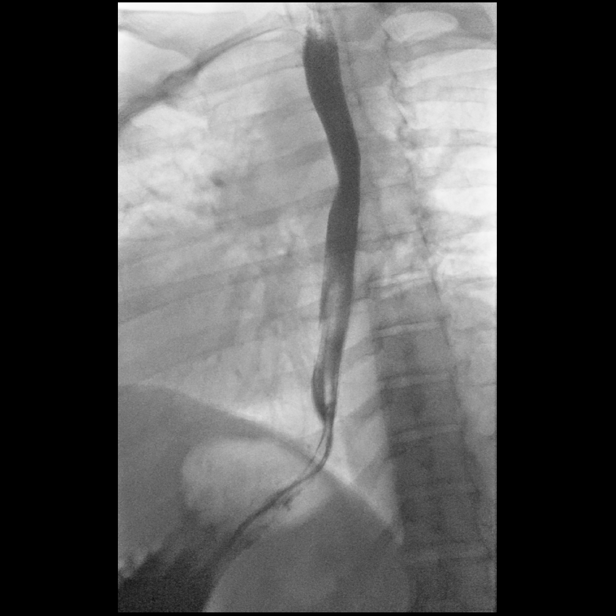
[frame 23/45]
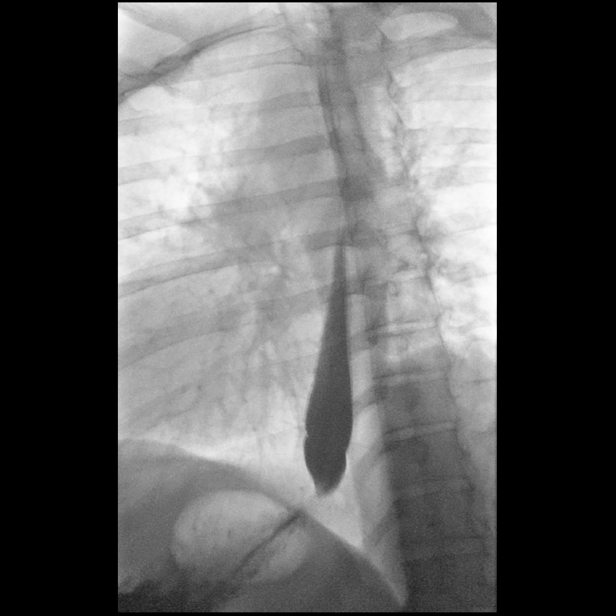

[Series 5: sequence · 2 of 52 frames shown (4 of 6)]
[frame 8/52]
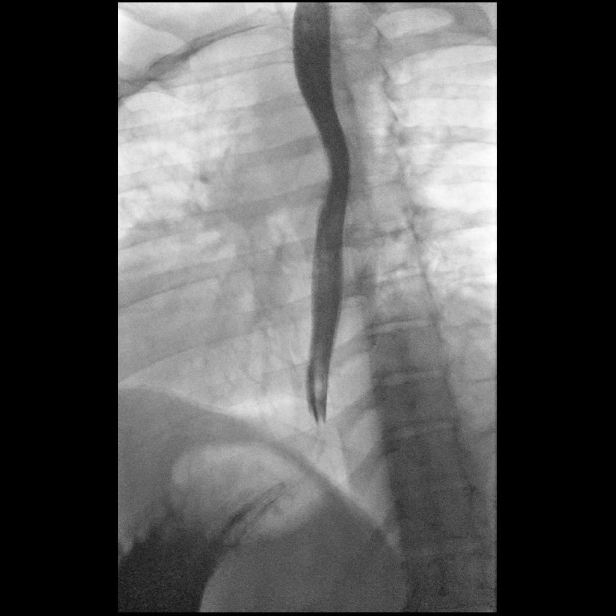
[frame 45/52]
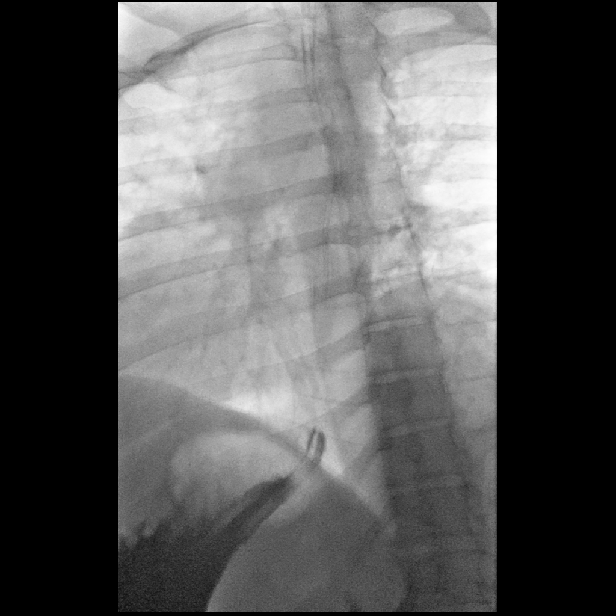

[Series 6: sequence · 2 of 66 frames shown (5 of 6)]
[frame 57/66]
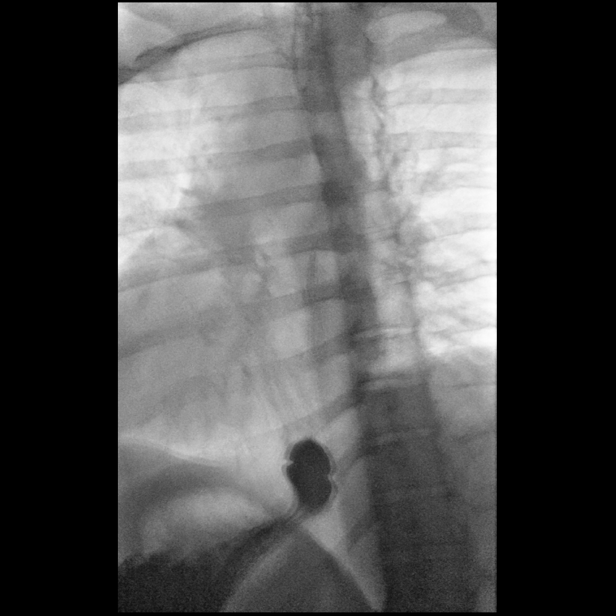
[frame 61/66]
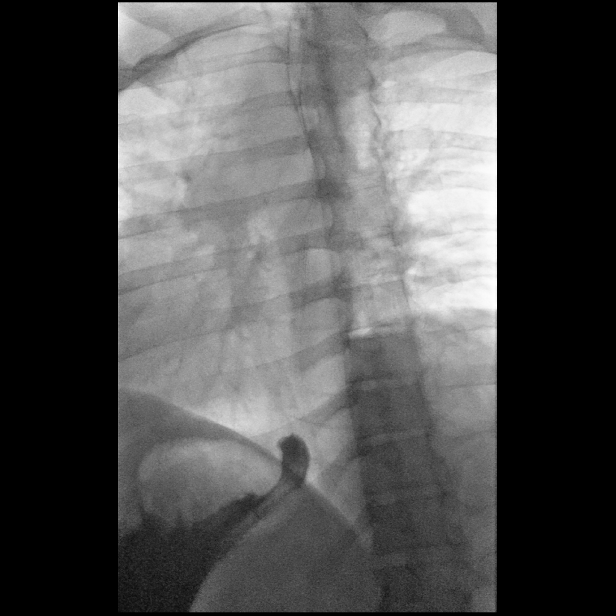

[Series 8: sequence · 1 of 7 frames shown (6 of 6)]
[frame 2/7]
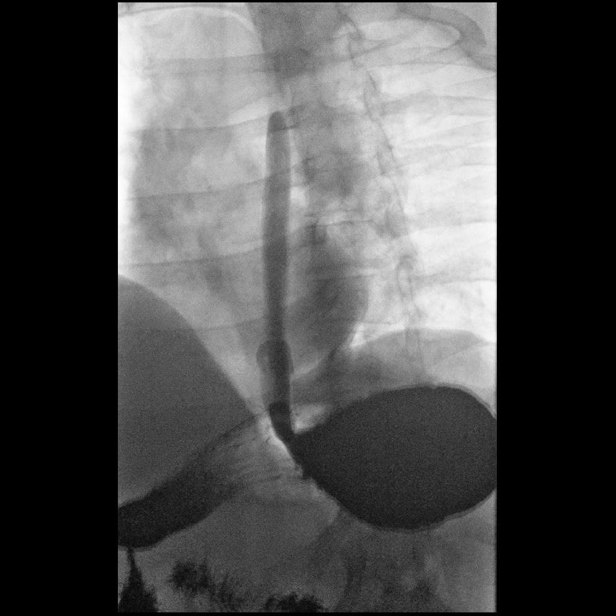

[Series 9: one shot · 1 of 1 slices shown (2 of 2)]
[im 1/1]
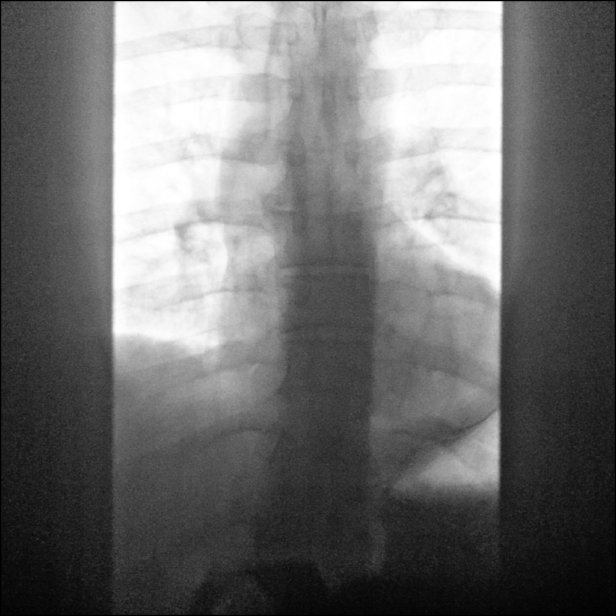

[14 of 24 positions shown; findings below may reference images not displayed]

FINDINGS: Normal oral and pharyngeal phases of swallowing, with no laryngeal
penetration or tracheobronchial aspiration. No cricopharyngeus
muscle dysfunction. No significant barium retention in the pharynx.
No evidence of pharyngeal mass, stricture or diverticulum.

Normal esophageal motility. Small sliding hiatal hernia. Moderate to
marked gastroesophageal reflux elicited to the level of the upper
thoracic esophagus with water siphon test. Granularity of the
esophageal mucosa throughout the thoracic esophagus, compatible with
reflux esophagitis. Minimal smooth focal eccentric narrowing at the
esophagogastric junction compatible with tiny Schatzki ring.
Otherwise normal esophageal distensibility. No discrete esophageal
mass or ulcer. Barium tablet traversed the esophagus into the
stomach without delay.
IMPRESSION: 1. Small sliding hiatal hernia. Moderate to marked gastroesophageal
reflux elicited.
2. Evidence of reflux esophagitis. Tiny Schatzki ring (without
significant narrowing as the barium tablet passed into the stomach
without delay). No discrete esophageal mass or ulcer.
3. Normal esophageal motility.

## 2023-08-26 DIAGNOSIS — R509 Fever, unspecified: Secondary | ICD-10-CM | POA: Diagnosis not present

## 2023-08-26 DIAGNOSIS — Z6837 Body mass index (BMI) 37.0-37.9, adult: Secondary | ICD-10-CM | POA: Diagnosis not present

## 2023-08-26 DIAGNOSIS — J029 Acute pharyngitis, unspecified: Secondary | ICD-10-CM | POA: Diagnosis not present

## 2023-08-26 DIAGNOSIS — J209 Acute bronchitis, unspecified: Secondary | ICD-10-CM | POA: Diagnosis not present

## 2023-08-27 DIAGNOSIS — J069 Acute upper respiratory infection, unspecified: Secondary | ICD-10-CM | POA: Diagnosis not present

## 2023-08-27 DIAGNOSIS — F419 Anxiety disorder, unspecified: Secondary | ICD-10-CM | POA: Diagnosis not present

## 2023-08-30 ENCOUNTER — Other Ambulatory Visit (HOSPITAL_COMMUNITY): Payer: Self-pay | Admitting: Family Medicine

## 2023-08-30 ENCOUNTER — Ambulatory Visit (HOSPITAL_COMMUNITY)
Admission: RE | Admit: 2023-08-30 | Discharge: 2023-08-30 | Disposition: A | Discharge status: Home / Self Care | Payer: BC Managed Care – PPO | Source: Ambulatory Visit | Attending: Family Medicine | Admitting: Family Medicine

## 2023-08-30 DIAGNOSIS — J40 Bronchitis, not specified as acute or chronic: Secondary | ICD-10-CM | POA: Diagnosis not present

## 2023-08-30 DIAGNOSIS — E6609 Other obesity due to excess calories: Secondary | ICD-10-CM | POA: Diagnosis not present

## 2023-08-30 DIAGNOSIS — J189 Pneumonia, unspecified organism: Secondary | ICD-10-CM | POA: Insufficient documentation

## 2023-08-30 DIAGNOSIS — Z6837 Body mass index (BMI) 37.0-37.9, adult: Secondary | ICD-10-CM | POA: Diagnosis not present

## 2023-08-30 DIAGNOSIS — R059 Cough, unspecified: Secondary | ICD-10-CM | POA: Diagnosis not present

## 2023-08-30 DIAGNOSIS — R0602 Shortness of breath: Secondary | ICD-10-CM | POA: Diagnosis not present

## 2024-03-19 ENCOUNTER — Emergency Department (HOSPITAL_COMMUNITY)
Admission: EM | Admit: 2024-03-19 | Discharge: 2024-03-19 | Discharge status: Against Medical Advice | Payer: Self-pay | Attending: Emergency Medicine | Admitting: Emergency Medicine

## 2024-03-19 ENCOUNTER — Other Ambulatory Visit: Payer: Self-pay

## 2024-03-19 ENCOUNTER — Emergency Department (HOSPITAL_COMMUNITY): Payer: Self-pay

## 2024-03-19 ENCOUNTER — Encounter (HOSPITAL_COMMUNITY): Payer: Self-pay

## 2024-03-19 DIAGNOSIS — R059 Cough, unspecified: Secondary | ICD-10-CM | POA: Insufficient documentation

## 2024-03-19 DIAGNOSIS — S299XXA Unspecified injury of thorax, initial encounter: Secondary | ICD-10-CM | POA: Diagnosis present

## 2024-03-19 DIAGNOSIS — Y92410 Unspecified street and highway as the place of occurrence of the external cause: Secondary | ICD-10-CM | POA: Diagnosis not present

## 2024-03-19 DIAGNOSIS — Z5321 Procedure and treatment not carried out due to patient leaving prior to being seen by health care provider: Secondary | ICD-10-CM | POA: Insufficient documentation

## 2024-03-19 NOTE — ED Notes (Signed)
Pt calledx2, no answer 
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
## 2024-04-22 ENCOUNTER — Other Ambulatory Visit (HOSPITAL_COMMUNITY)
Admission: RE | Admit: 2024-04-22 | Discharge: 2024-04-22 | Disposition: A | Discharge status: Home / Self Care | Payer: Self-pay | Source: Ambulatory Visit | Attending: Family Medicine | Admitting: Family Medicine

## 2024-04-22 DIAGNOSIS — E039 Hypothyroidism, unspecified: Secondary | ICD-10-CM | POA: Insufficient documentation

## 2024-04-22 DIAGNOSIS — I10 Essential (primary) hypertension: Secondary | ICD-10-CM | POA: Insufficient documentation

## 2024-04-22 DIAGNOSIS — R7309 Other abnormal glucose: Secondary | ICD-10-CM | POA: Insufficient documentation

## 2024-04-22 LAB — CBC WITH DIFFERENTIAL/PLATELET
Abs Immature Granulocytes: 0.02 K/uL (ref 0.00–0.07)
Basophils Absolute: 0.1 K/uL (ref 0.0–0.1)
Basophils Relative: 1 %
Eosinophils Absolute: 0.5 K/uL (ref 0.0–0.5)
Eosinophils Relative: 5 %
HCT: 46.6 % (ref 39.0–52.0)
Hemoglobin: 16.1 g/dL (ref 13.0–17.0)
Immature Granulocytes: 0 %
Lymphocytes Relative: 27 %
Lymphs Abs: 2.4 K/uL (ref 0.7–4.0)
MCH: 32.3 pg (ref 26.0–34.0)
MCHC: 34.5 g/dL (ref 30.0–36.0)
MCV: 93.4 fL (ref 80.0–100.0)
Monocytes Absolute: 0.6 K/uL (ref 0.1–1.0)
Monocytes Relative: 7 %
Neutro Abs: 5.4 K/uL (ref 1.7–7.7)
Neutrophils Relative %: 60 %
Platelets: 367 K/uL (ref 150–400)
RBC: 4.99 MIL/uL (ref 4.22–5.81)
RDW: 12.1 % (ref 11.5–15.5)
WBC: 9 K/uL (ref 4.0–10.5)
nRBC: 0 % (ref 0.0–0.2)

## 2024-04-22 LAB — COMPREHENSIVE METABOLIC PANEL WITH GFR
ALT: 53 U/L — ABNORMAL HIGH (ref 0–44)
AST: 26 U/L (ref 15–41)
Albumin: 4.7 g/dL (ref 3.5–5.0)
Alkaline Phosphatase: 100 U/L (ref 38–126)
Anion gap: 11 (ref 5–15)
BUN: 7 mg/dL (ref 6–20)
CO2: 26 mmol/L (ref 22–32)
Calcium: 9.7 mg/dL (ref 8.9–10.3)
Chloride: 99 mmol/L (ref 98–111)
Creatinine, Ser: 0.84 mg/dL (ref 0.61–1.24)
GFR, Estimated: >60 mL/min (ref >60)
Glucose, Bld: 107 mg/dL — ABNORMAL HIGH (ref 70–99)
Potassium: 4.3 mmol/L (ref 3.5–5.1)
Sodium: 136 mmol/L (ref 135–145)
Total Bilirubin: 0.9 mg/dL (ref 0.0–1.2)
Total Protein: 7.4 g/dL (ref 6.5–8.1)

## 2024-04-22 LAB — LIPID PANEL
Cholesterol: 176 mg/dL (ref 0–200)
HDL: 39 mg/dL — ABNORMAL LOW (ref >40)
LDL Cholesterol: 116 mg/dL — ABNORMAL HIGH (ref 0–99)
Total CHOL/HDL Ratio: 4.5 ratio
Triglycerides: 106 mg/dL (ref <150)
VLDL: 21 mg/dL (ref 0–40)

## 2024-04-22 LAB — TSH: TSH: 2.225 u[IU]/mL (ref 0.350–4.500)

## 2024-04-22 LAB — VITAMIN D 25 HYDROXY (VIT D DEFICIENCY, FRACTURES): Vit D, 25-Hydroxy: 59.94 ng/mL (ref 30–100)
